# Patient Record
Sex: Female | Born: 1955
Health system: Southern US, Community
[De-identification: ages and names within clinical notes are randomized; demographics above are authoritative.]

## PROBLEM LIST (undated history)

## (undated) DIAGNOSIS — I10 Essential (primary) hypertension: Secondary | ICD-10-CM

## (undated) DIAGNOSIS — R600 Localized edema: Secondary | ICD-10-CM

## (undated) DIAGNOSIS — E785 Hyperlipidemia, unspecified: Secondary | ICD-10-CM

## (undated) DIAGNOSIS — L719 Rosacea, unspecified: Secondary | ICD-10-CM

## (undated) DIAGNOSIS — R609 Edema, unspecified: Secondary | ICD-10-CM

## (undated) HISTORY — DX: Localized edema: R60.0

## (undated) HISTORY — DX: Edema, unspecified: R60.9

## (undated) HISTORY — DX: Rosacea, unspecified: L71.9

## (undated) HISTORY — PX: CYSTECTOMY: SUR359

## (undated) HISTORY — DX: Essential (primary) hypertension: I10

## (undated) HISTORY — DX: Hyperlipidemia, unspecified: E78.5

---

## 1998-03-04 ENCOUNTER — Ambulatory Visit (HOSPITAL_COMMUNITY): Admission: RE | Admit: 1998-03-04 | Discharge: 1998-03-04 | Payer: Self-pay | Admitting: *Deleted

## 1998-03-18 ENCOUNTER — Encounter: Admission: RE | Admit: 1998-03-18 | Discharge: 1998-06-16 | Payer: Self-pay | Admitting: *Deleted

## 1998-04-29 ENCOUNTER — Inpatient Hospital Stay (HOSPITAL_COMMUNITY): Admission: AD | Admit: 1998-04-29 | Discharge: 1998-04-29 | Payer: Self-pay | Admitting: Obstetrics and Gynecology

## 1998-05-08 ENCOUNTER — Inpatient Hospital Stay (HOSPITAL_COMMUNITY): Admission: AD | Admit: 1998-05-08 | Discharge: 1998-05-11 | Payer: Self-pay | Admitting: *Deleted

## 1998-06-16 ENCOUNTER — Other Ambulatory Visit: Admission: RE | Admit: 1998-06-16 | Discharge: 1998-06-16 | Payer: Self-pay | Admitting: *Deleted

## 2000-08-24 DIAGNOSIS — I10 Essential (primary) hypertension: Secondary | ICD-10-CM

## 2000-08-24 DIAGNOSIS — I152 Hypertension secondary to endocrine disorders: Secondary | ICD-10-CM | POA: Insufficient documentation

## 2001-01-24 DIAGNOSIS — L719 Rosacea, unspecified: Secondary | ICD-10-CM | POA: Insufficient documentation

## 2001-03-20 ENCOUNTER — Other Ambulatory Visit: Admission: RE | Admit: 2001-03-20 | Discharge: 2001-03-20 | Payer: Self-pay | Admitting: Family Medicine

## 2001-04-06 ENCOUNTER — Encounter: Payer: Self-pay | Admitting: Family Medicine

## 2001-04-06 ENCOUNTER — Ambulatory Visit (HOSPITAL_COMMUNITY): Admission: RE | Admit: 2001-04-06 | Discharge: 2001-04-06 | Payer: Self-pay | Admitting: Family Medicine

## 2001-11-24 ENCOUNTER — Encounter: Payer: Self-pay | Admitting: Family Medicine

## 2001-11-24 DIAGNOSIS — E1159 Type 2 diabetes mellitus with other circulatory complications: Secondary | ICD-10-CM | POA: Insufficient documentation

## 2001-11-24 DIAGNOSIS — E11319 Type 2 diabetes mellitus with unspecified diabetic retinopathy without macular edema: Secondary | ICD-10-CM | POA: Insufficient documentation

## 2001-11-24 DIAGNOSIS — E119 Type 2 diabetes mellitus without complications: Secondary | ICD-10-CM

## 2002-02-24 ENCOUNTER — Encounter: Payer: Self-pay | Admitting: Family Medicine

## 2002-04-26 HISTORY — PX: CARDIAC CATHETERIZATION: SHX172

## 2002-05-27 ENCOUNTER — Encounter: Payer: Self-pay | Admitting: Family Medicine

## 2002-12-17 ENCOUNTER — Ambulatory Visit (HOSPITAL_COMMUNITY): Admission: RE | Admit: 2002-12-17 | Discharge: 2002-12-17 | Payer: Self-pay | Admitting: *Deleted

## 2002-12-17 ENCOUNTER — Encounter: Payer: Self-pay | Admitting: *Deleted

## 2003-06-25 ENCOUNTER — Encounter: Payer: Self-pay | Admitting: Family Medicine

## 2003-06-25 LAB — CONVERTED CEMR LAB
Hgb A1c MFr Bld: 6 %
Microalbumin U total vol: 22.1 mg/L

## 2003-12-26 ENCOUNTER — Encounter: Payer: Self-pay | Admitting: Family Medicine

## 2004-06-04 ENCOUNTER — Ambulatory Visit: Payer: Self-pay | Admitting: Family Medicine

## 2004-09-02 ENCOUNTER — Emergency Department (HOSPITAL_COMMUNITY): Admission: EM | Admit: 2004-09-02 | Discharge: 2004-09-02 | Payer: Self-pay | Admitting: Emergency Medicine

## 2004-12-07 ENCOUNTER — Ambulatory Visit: Payer: Self-pay | Admitting: Family Medicine

## 2005-11-26 ENCOUNTER — Ambulatory Visit: Payer: Self-pay | Admitting: Family Medicine

## 2005-12-03 ENCOUNTER — Ambulatory Visit: Payer: Self-pay | Admitting: Family Medicine

## 2005-12-25 ENCOUNTER — Encounter: Payer: Self-pay | Admitting: Family Medicine

## 2005-12-31 ENCOUNTER — Ambulatory Visit: Payer: Self-pay | Admitting: Internal Medicine

## 2006-01-20 ENCOUNTER — Ambulatory Visit: Payer: Self-pay | Admitting: Family Medicine

## 2006-01-25 ENCOUNTER — Ambulatory Visit: Payer: Self-pay | Admitting: Family Medicine

## 2006-02-01 ENCOUNTER — Ambulatory Visit: Payer: Self-pay | Admitting: Family Medicine

## 2006-03-26 ENCOUNTER — Encounter: Payer: Self-pay | Admitting: Family Medicine

## 2006-03-26 LAB — CONVERTED CEMR LAB
Hgb A1c MFr Bld: 6.2 %
Pap Smear: NORMAL

## 2006-04-04 ENCOUNTER — Encounter: Payer: Self-pay | Admitting: Family Medicine

## 2006-04-04 ENCOUNTER — Other Ambulatory Visit: Admission: RE | Admit: 2006-04-04 | Discharge: 2006-04-04 | Payer: Self-pay | Admitting: Family Medicine

## 2006-04-04 ENCOUNTER — Ambulatory Visit: Payer: Self-pay | Admitting: Family Medicine

## 2006-04-05 ENCOUNTER — Ambulatory Visit: Payer: Self-pay | Admitting: Cardiovascular Disease

## 2006-04-28 ENCOUNTER — Ambulatory Visit: Payer: Self-pay | Admitting: Family Medicine

## 2006-06-25 ENCOUNTER — Encounter: Payer: Self-pay | Admitting: Family Medicine

## 2006-07-04 ENCOUNTER — Ambulatory Visit: Payer: Self-pay | Admitting: Family Medicine

## 2006-07-04 LAB — CONVERTED CEMR LAB: Hgb A1c MFr Bld: 6.7 % — ABNORMAL HIGH (ref 4.6–6.1)

## 2006-07-13 ENCOUNTER — Ambulatory Visit: Payer: Self-pay | Admitting: Family Medicine

## 2006-07-13 LAB — CONVERTED CEMR LAB
BUN: 18 mg/dL (ref 6–23)
CO2: 24 meq/L (ref 19–32)
Calcium: 9.9 mg/dL (ref 8.4–10.5)
Chloride: 104 meq/L (ref 96–112)
Creatinine, Ser: 0.94 mg/dL (ref 0.40–1.20)
Glucose, Bld: 93 mg/dL (ref 70–99)
Potassium: 3.6 meq/L (ref 3.5–5.3)
Sodium: 140 meq/L (ref 135–145)

## 2006-07-15 ENCOUNTER — Encounter: Payer: Self-pay | Admitting: Family Medicine

## 2006-07-18 DIAGNOSIS — R609 Edema, unspecified: Secondary | ICD-10-CM | POA: Insufficient documentation

## 2006-11-21 ENCOUNTER — Encounter (INDEPENDENT_AMBULATORY_CARE_PROVIDER_SITE_OTHER): Payer: Self-pay | Admitting: *Deleted

## 2006-12-07 ENCOUNTER — Encounter (INDEPENDENT_AMBULATORY_CARE_PROVIDER_SITE_OTHER): Payer: Self-pay | Admitting: *Deleted

## 2007-01-23 ENCOUNTER — Telehealth: Payer: Self-pay | Admitting: Family Medicine

## 2007-05-08 ENCOUNTER — Ambulatory Visit: Payer: Self-pay | Admitting: Family Medicine

## 2007-05-08 DIAGNOSIS — R0789 Other chest pain: Secondary | ICD-10-CM | POA: Insufficient documentation

## 2007-05-11 ENCOUNTER — Ambulatory Visit: Payer: Self-pay | Admitting: Family Medicine

## 2007-05-12 ENCOUNTER — Encounter: Payer: Self-pay | Admitting: Family Medicine

## 2007-05-12 ENCOUNTER — Ambulatory Visit: Payer: Self-pay

## 2007-05-15 ENCOUNTER — Ambulatory Visit: Payer: Self-pay | Admitting: Family Medicine

## 2007-05-15 DIAGNOSIS — E1169 Type 2 diabetes mellitus with other specified complication: Secondary | ICD-10-CM | POA: Insufficient documentation

## 2007-05-15 DIAGNOSIS — E785 Hyperlipidemia, unspecified: Secondary | ICD-10-CM

## 2007-05-17 ENCOUNTER — Telehealth (INDEPENDENT_AMBULATORY_CARE_PROVIDER_SITE_OTHER): Payer: Self-pay | Admitting: *Deleted

## 2007-05-30 ENCOUNTER — Telehealth: Payer: Self-pay | Admitting: Family Medicine

## 2007-07-07 ENCOUNTER — Telehealth: Payer: Self-pay | Admitting: Family Medicine

## 2007-07-17 ENCOUNTER — Emergency Department (HOSPITAL_COMMUNITY): Admission: EM | Admit: 2007-07-17 | Discharge: 2007-07-17 | Payer: Self-pay | Admitting: Emergency Medicine

## 2007-07-17 ENCOUNTER — Telehealth: Payer: Self-pay | Admitting: Family Medicine

## 2007-08-14 ENCOUNTER — Ambulatory Visit: Payer: Self-pay | Admitting: Family Medicine

## 2007-08-17 LAB — CONVERTED CEMR LAB
Cholesterol: 169 mg/dL (ref 0–200)
HDL: 29.9 mg/dL — ABNORMAL LOW (ref >39.0)
LDL Cholesterol: 118 mg/dL — ABNORMAL HIGH (ref 0–99)
Triglycerides: 105 mg/dL (ref 0–149)
VLDL: 21 mg/dL (ref 0–40)

## 2007-09-06 ENCOUNTER — Ambulatory Visit: Payer: Self-pay | Admitting: Family Medicine

## 2007-12-06 ENCOUNTER — Encounter (INDEPENDENT_AMBULATORY_CARE_PROVIDER_SITE_OTHER): Payer: Self-pay | Admitting: *Deleted

## 2007-12-11 ENCOUNTER — Encounter (INDEPENDENT_AMBULATORY_CARE_PROVIDER_SITE_OTHER): Payer: Self-pay | Admitting: *Deleted

## 2007-12-19 ENCOUNTER — Telehealth: Payer: Self-pay | Admitting: Family Medicine

## 2007-12-19 ENCOUNTER — Encounter: Payer: Self-pay | Admitting: Family Medicine

## 2008-03-25 ENCOUNTER — Ambulatory Visit: Payer: Self-pay | Admitting: Family Medicine

## 2008-03-29 ENCOUNTER — Ambulatory Visit: Payer: Self-pay | Admitting: Family Medicine

## 2008-03-29 LAB — CONVERTED CEMR LAB
Bilirubin, Direct: 0.2 mg/dL (ref 0.0–0.3)
Indirect Bilirubin: 0.8 mg/dL (ref 0.0–0.9)
LDL Cholesterol: 113 mg/dL — ABNORMAL HIGH (ref 0–99)
Total Protein: 7.7 g/dL (ref 6.0–8.3)
Triglycerides: 80 mg/dL (ref <150)
VLDL: 16 mg/dL (ref 0–40)

## 2008-04-09 ENCOUNTER — Ambulatory Visit: Payer: Self-pay | Admitting: Family Medicine

## 2008-04-09 DIAGNOSIS — R109 Unspecified abdominal pain: Secondary | ICD-10-CM | POA: Insufficient documentation

## 2008-04-09 DIAGNOSIS — S335XXA Sprain of ligaments of lumbar spine, initial encounter: Secondary | ICD-10-CM | POA: Insufficient documentation

## 2008-04-09 LAB — CONVERTED CEMR LAB
Bilirubin Urine: NEGATIVE
Glucose, Urine, Semiquant: NEGATIVE
pH: 5

## 2008-05-14 ENCOUNTER — Ambulatory Visit (HOSPITAL_COMMUNITY): Admission: RE | Admit: 2008-05-14 | Discharge: 2008-05-14 | Payer: Self-pay | Admitting: Family Medicine

## 2008-05-14 LAB — HM MAMMOGRAPHY

## 2008-05-16 ENCOUNTER — Encounter (INDEPENDENT_AMBULATORY_CARE_PROVIDER_SITE_OTHER): Payer: Self-pay | Admitting: *Deleted

## 2008-06-04 ENCOUNTER — Ambulatory Visit: Payer: Self-pay | Admitting: Family Medicine

## 2008-06-04 ENCOUNTER — Telehealth: Payer: Self-pay | Admitting: Family Medicine

## 2008-06-05 LAB — CONVERTED CEMR LAB
ALT: 18 units/L (ref 0–35)
AST: 18 units/L (ref 0–37)
Albumin: 4.5 g/dL (ref 3.5–5.2)
Basophils Absolute: 0 10*3/uL (ref 0.0–0.1)
Basophils Relative: 0 % (ref 0–1)
Calcium: 10.1 mg/dL (ref 8.4–10.5)
Chloride: 100 meq/L (ref 96–112)
MCHC: 33.9 g/dL (ref 30.0–36.0)
Monocytes Relative: 9 % (ref 3–12)
Neutro Abs: 2.5 10*3/uL (ref 1.7–7.7)
Neutrophils Relative %: 62 % (ref 43–77)
Platelets: 188 10*3/uL (ref 150–400)
Potassium: 3.6 meq/L (ref 3.5–5.3)
RBC: 4.49 M/uL (ref 3.87–5.11)
RDW: 12.7 % (ref 11.5–15.5)
Sodium: 137 meq/L (ref 135–145)

## 2008-06-07 ENCOUNTER — Ambulatory Visit: Payer: Self-pay

## 2008-06-17 ENCOUNTER — Ambulatory Visit: Payer: Self-pay | Admitting: Family Medicine

## 2008-06-20 ENCOUNTER — Encounter: Payer: Self-pay | Admitting: Family Medicine

## 2008-06-20 LAB — CONVERTED CEMR LAB
AST: 16 units/L (ref 0–37)
Alkaline Phosphatase: 53 units/L (ref 39–117)
BUN: 17 mg/dL (ref 6–23)
Calcium: 9.6 mg/dL (ref 8.4–10.5)
Creatinine, Ser: 0.94 mg/dL (ref 0.40–1.20)
Glucose, Bld: 180 mg/dL — ABNORMAL HIGH (ref 70–99)
HDL: 33 mg/dL — ABNORMAL LOW (ref >39)
Hgb A1c MFr Bld: 7.8 % — ABNORMAL HIGH (ref 4.6–6.1)
LDL Cholesterol: 72 mg/dL (ref 0–99)
Total CHOL/HDL Ratio: 3.8
Triglycerides: 94 mg/dL (ref <150)

## 2008-06-24 ENCOUNTER — Ambulatory Visit: Payer: Self-pay | Admitting: Family Medicine

## 2008-06-24 ENCOUNTER — Other Ambulatory Visit: Admission: RE | Admit: 2008-06-24 | Discharge: 2008-06-24 | Payer: Self-pay | Admitting: Family Medicine

## 2008-06-24 ENCOUNTER — Encounter: Payer: Self-pay | Admitting: Family Medicine

## 2008-06-27 ENCOUNTER — Encounter (INDEPENDENT_AMBULATORY_CARE_PROVIDER_SITE_OTHER): Payer: Self-pay | Admitting: *Deleted

## 2008-11-13 ENCOUNTER — Ambulatory Visit: Payer: Self-pay | Admitting: Family Medicine

## 2008-11-14 ENCOUNTER — Encounter: Payer: Self-pay | Admitting: Family Medicine

## 2008-11-18 LAB — CONVERTED CEMR LAB
BUN: 17 mg/dL (ref 6–23)
Chloride: 103 meq/L (ref 96–112)
Creatinine, Ser: 1.09 mg/dL (ref 0.40–1.20)
Glucose, Bld: 130 mg/dL — ABNORMAL HIGH (ref 70–99)

## 2009-01-02 ENCOUNTER — Ambulatory Visit: Payer: Self-pay | Admitting: Family Medicine

## 2009-01-02 DIAGNOSIS — R21 Rash and other nonspecific skin eruption: Secondary | ICD-10-CM | POA: Insufficient documentation

## 2009-02-07 ENCOUNTER — Ambulatory Visit: Payer: Self-pay | Admitting: Family Medicine

## 2009-02-07 DIAGNOSIS — E109 Type 1 diabetes mellitus without complications: Secondary | ICD-10-CM | POA: Insufficient documentation

## 2009-02-07 DIAGNOSIS — E119 Type 2 diabetes mellitus without complications: Secondary | ICD-10-CM | POA: Insufficient documentation

## 2009-02-10 LAB — CONVERTED CEMR LAB
Alkaline Phosphatase: 49 units/L (ref 39–117)
CO2: 25 meq/L (ref 19–32)
Cholesterol: 158 mg/dL (ref 0–200)
Creatinine, Ser: 0.97 mg/dL (ref 0.40–1.20)
Glucose, Bld: 112 mg/dL — ABNORMAL HIGH (ref 70–99)
HDL: 35 mg/dL — ABNORMAL LOW (ref >39)
LDL Cholesterol: 102 mg/dL — ABNORMAL HIGH (ref 0–99)
Sodium: 142 meq/L (ref 135–145)
Total Bilirubin: 1 mg/dL (ref 0.3–1.2)
Total CHOL/HDL Ratio: 4.5
Total Protein: 7.3 g/dL (ref 6.0–8.3)
Triglycerides: 107 mg/dL (ref <150)
VLDL: 21 mg/dL (ref 0–40)

## 2009-02-20 ENCOUNTER — Ambulatory Visit: Payer: Self-pay | Admitting: Family Medicine

## 2009-02-20 DIAGNOSIS — J309 Allergic rhinitis, unspecified: Secondary | ICD-10-CM | POA: Insufficient documentation

## 2009-02-20 DIAGNOSIS — J069 Acute upper respiratory infection, unspecified: Secondary | ICD-10-CM | POA: Insufficient documentation

## 2009-04-17 ENCOUNTER — Ambulatory Visit: Payer: Self-pay | Admitting: Family Medicine

## 2009-04-17 DIAGNOSIS — M549 Dorsalgia, unspecified: Secondary | ICD-10-CM | POA: Insufficient documentation

## 2009-04-17 DIAGNOSIS — M5126 Other intervertebral disc displacement, lumbar region: Secondary | ICD-10-CM | POA: Insufficient documentation

## 2009-06-16 ENCOUNTER — Telehealth: Payer: Self-pay | Admitting: Family Medicine

## 2009-09-03 ENCOUNTER — Ambulatory Visit: Payer: Self-pay | Admitting: Family Medicine

## 2009-09-05 ENCOUNTER — Ambulatory Visit: Payer: Self-pay | Admitting: Cardiovascular Disease

## 2009-09-11 ENCOUNTER — Ambulatory Visit: Payer: Self-pay | Admitting: Family Medicine

## 2009-09-17 LAB — CONVERTED CEMR LAB
ALT: 26 units/L (ref 0–35)
AST: 20 units/L (ref 0–37)
Alkaline Phosphatase: 66 units/L (ref 39–117)
Basophils Relative: 0.6 % (ref 0.0–3.0)
Bilirubin, Direct: 0.1 mg/dL (ref 0.0–0.3)
CO2: 31 meq/L (ref 19–32)
Chloride: 98 meq/L (ref 96–112)
Creatinine, Ser: 1 mg/dL (ref 0.4–1.2)
Eosinophils Absolute: 0.2 10*3/uL (ref 0.0–0.7)
Eosinophils Relative: 4.3 % (ref 0.0–5.0)
HCT: 38.3 % (ref 36.0–46.0)
LDL Cholesterol: 72 mg/dL (ref 0–99)
Lymphs Abs: 1.1 10*3/uL (ref 0.7–4.0)
MCHC: 34.8 g/dL (ref 30.0–36.0)
MCV: 87.7 fL (ref 78.0–100.0)
Monocytes Absolute: 0.4 10*3/uL (ref 0.1–1.0)
Neutrophils Relative %: 62.5 % (ref 43.0–77.0)
Platelets: 197 10*3/uL (ref 150.0–400.0)
Potassium: 3.7 meq/L (ref 3.5–5.1)
Sodium: 138 meq/L (ref 135–145)
Total Bilirubin: 0.8 mg/dL (ref 0.3–1.2)
Total CHOL/HDL Ratio: 4
Total Protein: 7.7 g/dL (ref 6.0–8.3)
Triglycerides: 132 mg/dL (ref 0.0–149.0)
WBC: 4.9 10*3/uL (ref 4.5–10.5)

## 2009-09-19 ENCOUNTER — Ambulatory Visit: Payer: Self-pay | Admitting: Family Medicine

## 2009-09-19 LAB — CONVERTED CEMR LAB: Hgb A1c MFr Bld: 8.8 % — ABNORMAL HIGH (ref 4.6–6.5)

## 2010-05-17 ENCOUNTER — Encounter: Payer: Self-pay | Admitting: Family Medicine

## 2010-05-24 LAB — CONVERTED CEMR LAB
ALT: 14 units/L (ref 0–35)
AST: 14 units/L (ref 0–37)
Alkaline Phosphatase: 76 units/L (ref 39–117)
Cholesterol: 178 mg/dL (ref 0–200)
Creatinine, Urine: 223.6 mg/dL
Indirect Bilirubin: 0.8 mg/dL (ref 0.0–0.9)
Microalb, Ur: 7.07 mg/dL — ABNORMAL HIGH (ref 0.00–1.89)
Total Protein: 8.1 g/dL (ref 6.0–8.3)
Triglycerides: 77 mg/dL (ref <150)

## 2010-05-28 NOTE — Letter (Signed)
Summary: PHI  PHI   Imported By: Harlon Flor 09/08/2009 16:49:00  _____________________________________________________________________  External Attachment:    Type:   Image     Comment:   External Document

## 2010-05-28 NOTE — Assessment & Plan Note (Signed)
Summary: RIGHT SIDE CHEST PAIN X SEVERAL DAYS   Vital Signs:  Patient profile:   55 year old female Height:      67 inches Weight:      181.25 pounds BMI:     28.49 Temp:     98.3 degrees F oral Pulse rate:   60 / minute Pulse rhythm:   regular BP sitting:   142 / 80  (left arm) Cuff size:   regular  Vitals Entered By: Linde Gillis CMA Duncan Dull) (Sep 03, 2009 2:02 PM) CC: right sided chest pain times one week   History of Present Illness: High risk patient with DM, HTn and high cholesterol with chespt pain x 1 week. Describes as intermittant chest pain.Marland Kitchenon left and right..has current pain.  Initial worse for 3-4 min  then improves some but nagging pan for 1 hour. Occurs at rest and sometimes with exertion. no relationship to eating.  Occ feels pullling in rightchest when turing head to left. Fatgue, short of breath with walking when having chest pain. Feels like pressure. No peripheral swelling.  No pain radiating down left arm or up neck. No sweating. Occ sour taste in mouth, no relux, no burping, o abdominal pain.   4/10 on pain scale.  Not taking any medicaiotn.   Last stress test neg several years ago. Neg cath in 2004.  Problems Prior to Update: 1)  Chest Pain, Atypical  (ICD-786.59) 2)  Back Pain  (ICD-724.5) 3)  Herniated Lumbar Disc  (ICD-722.10) 4)  Uri  (ICD-465.9) 5)  Allergic Rhinitis  (ICD-477.9) 6)  Diab w/o Comp Type I [juv] Not Stated Uncntrl  (ICD-250.01) 7)  Skin Rash  (ICD-782.1) 8)  Routine Gynecological Examination  (ICD-V72.31) 9)  Well Adult Exam  (ICD-V70.0) 10)  Abdominal Pain, Lower  (ICD-789.09) 11)  Lumbar Strain, Acute  (ICD-847.2) 12)  Other Screening Mammogram  (ICD-V76.12) 13)  Hyperlipidemia  (ICD-272.4) 14)  Chest Pain, Atypical  (ICD-786.59) 15)  Family History of Cad Female 1st Degree Relative <50  (ICD-V17.3) 16)  Family History of Cad Female 1st Degree Relative <60  (ICD-V16.49) 17)  Family History Diabetes 1st Degree Relative   (ICD-V18.0) 18)  Hx of Peripheral Edema  (ICD-782.3) 19)  Aodm  (ICD-250.00) 20)  Hypertension, Malignant  (ICD-401.0) 21)  Rosacea  (ICD-695.3)  Current Medications (verified): 1)  Benicar Hct 40-25 Mg Tabs (Olmesartan Medoxomil-Hctz) .... Take 1 Tablet By Mouth Once A Day 2)  Ramipril 10 Mg Caps (Ramipril) .Marland Kitchen.. 1 Tab By Mouth Two Times A Day 3)  Metoprolol Succinate 50 Mg Xr24h-Tab (Metoprolol Succinate) .... Take 1 Tablet By Mouth Once A Day 4)  Simvastatin 40 Mg  Tabs (Simvastatin) .... Take 1 Tablet By Mouth Once A Day 5)  Amlodipine Besylate 5 Mg Tabs (Amlodipine Besylate) .Marland Kitchen.. 1 Tab By Mouth Daily 6)  Metformin Hcl 500 Mg Xr24h-Tab (Metformin Hcl) .... 2 Tab By Mouth Daily 7)  Glucometer of Choice .... Check Blood Sugar Fasting and After One Meal of The Day Dx 250.00 8)  Test Strips of Choice .... Check Blood Sugar Fasting and After One Meal of The Day Dx 250.00 9)  Lancets  Misc (Lancets) .... Check Blood Sugar Twice Daily Dx 250.00 10)  Cyclobenzaprine Hcl 10 Mg  Tabs (Cyclobenzaprine Hcl) .Marland Kitchen.. 1 By Mouth 3 Times Daily As Needed For Back Pain 11)  Hydrocodone-Acetaminophen 5-325 Mg Tabs (Hydrocodone-Acetaminophen) .Marland Kitchen.. 1 By Mouth Up To 4 Times Per Day As Needed For Pain  Allergies (verified): No Known  Drug Allergies  Past History:  Past medical, surgical, family and social histories (including risk factors) reviewed, and no changes noted (except as noted below).  Past Medical History: Reviewed history from 04/17/2009 and no changes required. HYPERLIPIDEMIA (ICD-272.4) Hx of PERIPHERAL EDEMA (ICD-782.3) AODM (ICD-250.00) HYPERTENSION, MALIGNANT (ICD-401.0) ROSACEA (ICD-695.3)    Past Surgical History: Reviewed history from 07/15/2006 and no changes required. Cardiac cath - normal 8/04 venous doppler - negative 12/07 Foot exam :  12/2005  Family History: Reviewed history from 07/15/2006 and no changes required. father: CVA, CAD mother: pacemaker, sleep apnea, heart  problems 4 brothers: DM, liver cirrhosis, ETOH abuse asthma, HTN 5 sisters: HTN, DM Family History Diabetes 1st degree relative Family History of CAD Female 1st degree relative <60 Family History of CAD Female 1st degree relative <50  Social History: Reviewed history from 07/15/2006 and no changes required. 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 CV:  Complains of chest pain or discomfort; denies fainting, leg cramps with exertion, swelling of feet, and swelling of hands. Resp:  Complains of shortness of breath; denies cough. GI:  Denies abdominal pain. GU:  Denies dysuria.  Physical Exam  General:  overweight female in NAD Mouth:  MMM Neck:  no carotid bruit or thyromegaly no cervical or supraclavicular lymphadenopathy  Lungs:  Normal respiratory effort, chest expands symmetrically. Lungs are clear to auscultation, no crackles or wheezes. Heart:  Normal rate and regular rhythm. S1 and S2 normal without gallop, murmur, click, rub or other extra sounds. Abdomen:  Bowel sounds positive,abdomen soft and non-tender without masses, organomegaly or hernias noted. Pulses:  R and L posterior tibial pulses are full and equal bilaterally  Extremities:  no edmea    Impression & Recommendations:  Problem # 1:  CHEST PAIN, ATYPICAL (ICD-786.59) Several years ago neg cardioyte. EKG today..no clear new symtpoms. Given pt high risk and with concerning possibly cardiac symptoms..will refer to Cardiology for eval and possible Cardolyte in next few days.  Given possible.Marland KitchenGERD symptoms will add PPI. No clear infectious symptoms, MSK pain or anxiety.  Go to ER if chest pain increasing.  Orders: EKG w/ Interpretation (93000) Cardiology Referral (Cardiology)  Problem # 2:  AODM (ICD-250.00)  Inadequate control. Overdue for eval.  Her updated medication list for  this problem includes:    Benicar Hct 40-25 Mg Tabs (Olmesartan medoxomil-hctz) .Marland Kitchen... Take 1 tablet by mouth once a day    Ramipril 10 Mg Caps (Ramipril) .Marland Kitchen... 1 tab by mouth two times a day    Metformin Hcl 500 Mg Xr24h-tab (Metformin hcl) .Marland Kitchen... 2 tab by mouth daily  Labs Reviewed: Creat: 0.97 (02/07/2009)   Microalbumin: 22.1 (06/25/2003) Reviewed HgBA1c results: 7.6 (02/07/2009)  7.6 (11/14/2008)  Orders: Cardiology Referral (Cardiology)  Problem # 3:  HYPERLIPIDEMIA (ICD-272.4)  INadequate control.Marland Kitchenoverdue for eval.  Her updated medication list for this problem includes:    Simvastatin 40 Mg Tabs (Simvastatin) .Marland Kitchen... Take 1 tablet by mouth once a day  Labs Reviewed: SGOT: 18 (02/07/2009)   SGPT: 18 (02/07/2009)  Lipid Goals: Chol Goal: 200 (05/15/2007)   HDL Goal: 40 (05/15/2007)   LDL Goal: 100 (05/15/2007)   TG Goal: 150 (05/15/2007)  Prior 10 Yr Risk Heart Disease: 24 % (02/20/2009)   HDL:35 (02/07/2009), 33 (06/17/2008)  LDL:102 (02/07/2009), 72 (19/14/7829)  Chol:158 (02/07/2009), 124 (06/17/2008)  Trig:107 (02/07/2009), 94 (06/17/2008)  Orders: Cardiology Referral (Cardiology)  Problem # 4:  HYPERTENSION, MALIGNANT (ICD-401.0)  Fairly well controlled for her at this time.  Her updated medication list for this problem includes:    Benicar Hct 40-25 Mg Tabs (Olmesartan medoxomil-hctz) .Marland Kitchen... Take 1 tablet by mouth once a day    Ramipril 10 Mg Caps (Ramipril) .Marland Kitchen... 1 tab by mouth two times a day    Metoprolol Succinate 50 Mg Xr24h-tab (Metoprolol succinate) .Marland Kitchen... Take 1 tablet by mouth once a day    Amlodipine Besylate 5 Mg Tabs (Amlodipine besylate) .Marland Kitchen... 1 tab by mouth daily  BP today: 142/80 Prior BP: 150/100 (04/17/2009)  Prior 10 Yr Risk Heart Disease: 24 % (02/20/2009)  Labs Reviewed: K+: 3.6 (02/07/2009) Creat: : 0.97 (02/07/2009)   Chol: 158 (02/07/2009)   HDL: 35 (02/07/2009)   LDL: 102 (02/07/2009)   TG: 107 (02/07/2009)  Orders: Cardiology Referral  (Cardiology)  Complete Medication List: 1)  Benicar Hct 40-25 Mg Tabs (Olmesartan medoxomil-hctz) .... Take 1 tablet by mouth once a day 2)  Ramipril 10 Mg Caps (Ramipril) .Marland Kitchen.. 1 tab by mouth two times a day 3)  Metoprolol Succinate 50 Mg Xr24h-tab (Metoprolol succinate) .... Take 1 tablet by mouth once a day 4)  Simvastatin 40 Mg Tabs (Simvastatin) .... Take 1 tablet by mouth once a day 5)  Amlodipine Besylate 5 Mg Tabs (Amlodipine besylate) .Marland Kitchen.. 1 tab by mouth daily 6)  Metformin Hcl 500 Mg Xr24h-tab (Metformin hcl) .... 2 tab by mouth daily 7)  Glucometer of Choice  .... Check blood sugar fasting and after one meal of the day dx 250.00 8)  Test Strips of Choice  .... Check blood sugar fasting and after one meal of the day dx 250.00 9)  Lancets Misc (Lancets) .... Check blood sugar twice daily dx 250.00 10)  Cyclobenzaprine Hcl 10 Mg Tabs (Cyclobenzaprine hcl) .Marland Kitchen.. 1 by mouth 3 times daily as needed for back pain 11)  Hydrocodone-acetaminophen 5-325 Mg Tabs (Hydrocodone-acetaminophen) .Marland Kitchen.. 1 by mouth up to 4 times per day as needed for pain 12)  Omeprazole 40 Mg Cpdr (Omeprazole) .Marland Kitchen.. 1 tab by mouth daily  Patient Instructions: 1)  Fasting lipids, CMET, TSH, cbc Dx 401.1 2)  Go to ER if severe or worsening  chest pain and shortness of breath.  3)  Start omeprsazole 40 mg daily. 4)  Referral Appointment Information 5)  Day/Date: 6)  Time: 7)  Place/MD: 8)  Address: 9)  Phone/Fax: 10)  Patient given appointment information. Information/Orders faxed/mailed.  Prescriptions: OMEPRAZOLE 40 MG CPDR (OMEPRAZOLE) 1 tab by mouth daily  #30 x 11   Entered and Authorized by:   Kerby Nora MD   Signed by:   Kerby Nora MD on 09/03/2009   Method used:   [facility] to        [facility] [phone]* (retail)       [address]       [place], [place]  [address]       Ph: [phone]       Fax: [phone]   RxID:   [id number]   Current Allergies (reviewed today): No  known allergies

## 2010-05-28 NOTE — Assessment & Plan Note (Signed)
Summary: CHEST PAIN/AMD   Primary Provider:  Kerby Nora MD  CC:  Consult; Chest pain; SOB.  History of Present Illness: 55 year old woman with a history of diabetes, hyperlipidemia, hypertension, family  history of coronary artery disease, presenting with chest discomfort radiating from the right side to the left side that lasted all week long last week it seemed to wax and wane.  She described her pain is predominantly on the right side, hurting more when she turned her head to the right with a clear positional nature to her chest pain, sometimes radiating a little bit to the left. It was constant, seemed to come on at rest and sometimes with exertion. It seemed to improve with proton pump inhibitor medication. She has not tried nonsteroidal anti-inflammatories.  cardiac catheter in 2004 showing minimal disease. Chest pain at that time was thought secondary to noncardiac etiology.  Stress test in 2000 Showed ejection fraction 59%, no significant EKG changes, no signs of ischemia.  Blood work from October 2010 shows total cholesterol 158, LDL 102, HDL 35, normal LFTs. Hemoglobin A1c 7.6.  Problems Prior to Update: 1)  Chest Pain, Atypical  (ICD-786.59) 2)  Back Pain  (ICD-724.5) 3)  Herniated Lumbar Disc  (ICD-722.10) 4)  Uri  (ICD-465.9) 5)  Allergic Rhinitis  (ICD-477.9) 6)  Diab w/o Comp Type I [juv] Not Stated Uncntrl  (ICD-250.01) 7)  Skin Rash  (ICD-782.1) 8)  Routine Gynecological Examination  (ICD-V72.31) 9)  Well Adult Exam  (ICD-V70.0) 10)  Abdominal Pain, Lower  (ICD-789.09) 11)  Lumbar Strain, Acute  (ICD-847.2) 12)  Other Screening Mammogram  (ICD-V76.12) 13)  Hyperlipidemia  (ICD-272.4) 14)  Chest Pain, Atypical  (ICD-786.59) 15)  Family History of Cad Female 1st Degree Relative <50  (ICD-V17.3) 16)  Family History of Cad Female 1st Degree Relative <60  (ICD-V16.49) 17)  Family History Diabetes 1st Degree Relative  (ICD-V18.0) 18)  Hx of Peripheral Edema   (ICD-782.3) 19)  Aodm  (ICD-250.00) 20)  Hypertension, Malignant  (ICD-401.0) 21)  Rosacea  (ICD-695.3)  Medications Prior to Update: 1)  Benicar Hct 40-25 Mg Tabs (Olmesartan Medoxomil-Hctz) .... Take 1 Tablet By Mouth Once A Day 2)  Ramipril 10 Mg Caps (Ramipril) .Marland Kitchen.. 1 Tab By Mouth Two Times A Day 3)  Metoprolol Succinate 50 Mg Xr24h-Tab (Metoprolol Succinate) .... Take 1 Tablet By Mouth Once A Day 4)  Simvastatin 40 Mg  Tabs (Simvastatin) .... Take 1 Tablet By Mouth Once A Day 5)  Amlodipine Besylate 5 Mg Tabs (Amlodipine Besylate) .Marland Kitchen.. 1 Tab By Mouth Daily 6)  Metformin Hcl 500 Mg Xr24h-Tab (Metformin Hcl) .... 2 Tab By Mouth Daily 7)  Glucometer of Choice .... Check Blood Sugar Fasting and After One Meal of The Day Dx 250.00 8)  Test Strips of Choice .... Check Blood Sugar Fasting and After One Meal of The Day Dx 250.00 9)  Lancets  Misc (Lancets) .... Check Blood Sugar Twice Daily Dx 250.00 10)  Cyclobenzaprine Hcl 10 Mg  Tabs (Cyclobenzaprine Hcl) .Marland Kitchen.. 1 By Mouth 3 Times Daily As Needed For Back Pain 11)  Hydrocodone-Acetaminophen 5-325 Mg Tabs (Hydrocodone-Acetaminophen) .Marland Kitchen.. 1 By Mouth Up To 4 Times Per Day As Needed For Pain 12)  Omeprazole 40 Mg Cpdr (Omeprazole) .Marland Kitchen.. 1 Tab By Mouth Daily  Current Medications (verified): 1)  Benicar Hct 40-25 Mg Tabs (Olmesartan Medoxomil-Hctz) .... Take 1 Tablet By Mouth Once A Day 2)  Ramipril 10 Mg Caps (Ramipril) .Marland Kitchen.. 1 Tab By Mouth Two Times A Day  3)  Metoprolol Succinate 50 Mg Xr24h-Tab (Metoprolol Succinate) .... Take 1 Tablet By Mouth Once A Day 4)  Simvastatin 40 Mg  Tabs (Simvastatin) .... Take 1 Tablet By Mouth Once A Day 5)  Amlodipine Besylate 5 Mg Tabs (Amlodipine Besylate) .Marland Kitchen.. 1 Tab By Mouth Daily 6)  Metformin Hcl 500 Mg Xr24h-Tab (Metformin Hcl) .... 2 Tab By Mouth Daily 7)  Glucometer of Choice .... Check Blood Sugar Fasting and After One Meal of The Day Dx 250.00 8)  Test Strips of Choice .... Check Blood Sugar Fasting and  After One Meal of The Day Dx 250.00 9)  Lancets  Misc (Lancets) .... Check Blood Sugar Twice Daily Dx 250.00 10)  Cyclobenzaprine Hcl 10 Mg  Tabs (Cyclobenzaprine Hcl) .Marland Kitchen.. 1 By Mouth 3 Times Daily As Needed For Back Pain 11)  Hydrocodone-Acetaminophen 5-325 Mg Tabs (Hydrocodone-Acetaminophen) .Marland Kitchen.. 1 By Mouth Up To 4 Times Per Day As Needed For Pain 12)  Omeprazole 40 Mg Cpdr (Omeprazole) .Marland Kitchen.. 1 Tab By Mouth Daily  Allergies (verified): No Known Drug Allergies  Past History:  Past Medical History: Last updated: 04/17/2009 HYPERLIPIDEMIA (ICD-272.4) Hx of PERIPHERAL EDEMA (ICD-782.3) AODM (ICD-250.00) HYPERTENSION, MALIGNANT (ICD-401.0) ROSACEA (ICD-695.3)    Past Surgical History: Last updated: 07/15/2006 Cardiac cath - normal 8/04 venous doppler - negative 12/07 Foot exam :  12/2005  Family History: Last updated: 07/15/2006 father: CVA, CAD mother: pacemaker, sleep apnea, heart problems 4 brothers: DM, liver cirrhosis, ETOH abuse asthma, HTN 5 sisters: HTN, DM Family History Diabetes 1st degree relative Family History of CAD Female 1st degree relative <60 Family History of CAD Female 1st degree relative <50  Social History: Last updated: 07/15/2006 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  The patient denies fever, weight loss, weight gain, vision loss, decreased hearing, hoarseness, syncope, dyspnea on exertion, peripheral edema, prolonged cough, abdominal pain, incontinence, muscle weakness, depression, and enlarged lymph nodes.    Vital Signs:  Patient profile:   55 year old female Height:      67 inches Weight:      183 pounds BMI:     28.77 Pulse rate:   58 / minute BP sitting:   140 / 86  (left arm) Cuff size:   regular  Vitals  Entered By: Stanton Kidney, EMT-P (Sep 05, 2009 2:08 PM)  Physical Exam  General:  middle aged woman in no apparent distress, HENT exam is benign, or branch is clear, neck is supple with no JVP or carotid bruits, heart sounds are regular with S1-S2 and no murmurs appreciated, lungs are clear to auscultation with no wheezes or rales, abdominal exam is benign, no significant lower extremity edema, neurologic exam is grossly nonfocal, skin is warm and dry. Pulses are equal and symmetrical in her upper and lower extremities   EKG  Procedure date:  09/05/2009  Findings:      normal sinus rhythm with rate of 50 beats per minute, T wave inversions noted in leads 2, 3, aVF.  Impression & Recommendations:  Problem # 1:  CHEST PAIN, ATYPICAL (ICD-786.59) etiology of her chest pain is uncertain though clearly has some atypical components to it. It hurts more when she turns her head to the right. It has been getting better, seems to come on at rest as well  sometimes with exertion.  She has had a negative catheterization in the past, negative stress test in 2009. Given that her symptoms are somewhat atypical and resolving, I suggest that we watch her for now. I've urged her to resume her walking/exercise. If she develops symptoms that seem to be getting worse, and asked her to contact me. We will perform a repeat stress test for her.  Her blood pressure is borderline high though she states is well controlled at home.  We will call her in several days time to make sure that her chest pain is in fact resolving. She could try nonsteroidal anti-inflammatory medications if there is a musculoskeletal component. I cannot absolutely exclude pericarditis as she has had some EKG changes. Treatment for this would be ibuprofen and high doses. Her pattern does not typically fit pericarditis as it isbrought on with head turning and not positional changes.  Her updated medication list for this problem includes:     Ramipril 10 Mg Caps (Ramipril) .Marland Kitchen... 1 tab by mouth once daily    Metoprolol Succinate 50 Mg Xr24h-tab (Metoprolol succinate) .Marland Kitchen... Take 1 tablet by mouth once a day    Amlodipine Besylate 5 Mg Tabs (Amlodipine besylate) .Marland Kitchen... 1 tab by mouth daily  Problem # 2:  DIAB W/O COMP TYPE I [JUV] NOT STATED UNCNTRL (ICD-250.01) Hemoglobin A1c is 7.6 in October of last year. I've asked her to watch her diet to try to lose several pounds in an effort to get her hemoglobin A1c less than 7.  Ideally her LDL should be less than 100 and it is very close to that in October.  Her updated medication list for this problem includes:    Benicar Hct 40-25 Mg Tabs (Olmesartan medoxomil-hctz) .Marland Kitchen... Take 1 tablet by mouth once a day    Ramipril 10 Mg Caps (Ramipril) .Marland Kitchen... 1 tab by mouth once daily    Metformin Hcl 500 Mg Xr24h-tab (Metformin hcl) .Marland Kitchen... Take 1 by mouth once daily  Patient Instructions: 1)  Your physician recommends that you schedule a follow-up appointment in: 3 months

## 2010-05-28 NOTE — Progress Notes (Signed)
Summary: pharmacy wants to change benicar  Phone Note From Pharmacy   [facility]  [address] [phone] Summary of Call: Pharmacy wants to change pt from benicar to losartan/ hctz.  Form is on your desk. Initial call taken by: Lowella Petties CMA,  June 16, 2009 12:10 PM     Appended Document: pharmacy wants to change benicar [facility] [phone]

## 2010-07-15 ENCOUNTER — Other Ambulatory Visit: Payer: Self-pay | Admitting: Family Medicine

## 2010-07-30 ENCOUNTER — Encounter: Payer: Self-pay | Admitting: *Deleted

## 2010-08-03 ENCOUNTER — Ambulatory Visit: Payer: Self-pay | Admitting: Family Medicine

## 2010-09-11 NOTE — Cardiovascular Report (Signed)
Dana Rogers, Dana Rogers                         ACCOUNT NO.:  [id number]   MEDICAL RECORD NO.:  [id number]                   PATIENT TYPE:  OIB   LOCATION:  2860                                 FACILITY:  [facility]   PHYSICIAN:  Veneda Melter, M.D.                   DATE OF BIRTH:  25-Nov-1955   DATE OF PROCEDURE:  12/17/2002  DATE OF DISCHARGE:                              CARDIAC CATHETERIZATION   PROCEDURES PERFORMED:  1. Left heart catheterization.  2. Left ventriculogram.  3. Selective coronary angiography.   DIAGNOSES:  1. Trivial coronary artery disease by angiogram.  2. Normal left ventricular systolic function.   HISTORY:  Dana Rogers is a 55 year old black female who presents with  substernal chest discomfort that has been increasing in frequency and  severity.  The patient has a history of hypertension and mild glucose  intolerance as well as a strong family history of heart disease and  hypertension.  She presents for further cardiac assessment.   TECHNIQUE:  Informed consent was obtained.  The patient was brought to the  catheterization laboratory.  A 6-French sheath placed in the right femoral  artery using modified Seldinger technique.  A 6-French JL4 and JR4 catheter  was then used to engage the left and right coronary arteries.  Selective  angiography performed in various projections using manual injections of  contrast.  A 6-French pigtail catheter was advanced in the left ventricle  and a left ventriculogram performed using power injections of contrast.  At  the termination of this case catheters and sheath were removed and a  Perclose suture closure device deployed to the right femoral artery until  adequate hemostasis achieved.  The patient tolerated procedure well and was  transferred to floor in stable condition.   FINDINGS:  1. Left main trunk:  Large caliber vessel.  Angiographically normal.  2. LAD is a medium caliber vessel that provides three diagonal  branches.     The LAD has luminal irregularities.  3. Left circumflex artery is a large caliber vessel and provides three     marginal branches.  The left circumflex system has luminal     irregularities.  4. Right coronary artery is dominant.  This is a medium caliber vessel that     provides a small posterior descending artery and a posterior ventricular     branch in the terminal segment.  The right coronary has luminal     irregularities  5. LV:  Normal end-systolic and end-diastolic dimensions.  Overall left     ventricular function is well preserved.  Ejection fraction greater than     65%.  There is no mitral regurgitation.  LV pressure is 180/5.  Aortic is     180/100.  LVEDP equals 15.    ASSESSMENT/PLAN:  Dana Rogers is a 55 year old female who presents with  crescendo chest discomfort of unclear etiology.  She  has trivial coronary  artery disease.  At this point aggressive medical therapy will be pursued  regarding her blood pressure and other cause of chest pain investigated.                                               Veneda Melter, M.D.    NG/MEDQ  D:  12/17/2002  T:  12/17/2002  Job:  [id number]   cc:   Rosalyn Gess. Norins, M.D. [facility]

## 2010-09-15 ENCOUNTER — Other Ambulatory Visit: Payer: Self-pay | Admitting: Family Medicine

## 2010-10-14 ENCOUNTER — Other Ambulatory Visit: Payer: Self-pay | Admitting: Family Medicine

## 2010-11-10 ENCOUNTER — Ambulatory Visit (INDEPENDENT_AMBULATORY_CARE_PROVIDER_SITE_OTHER): Payer: BC Managed Care – PPO | Admitting: Family Medicine

## 2010-11-10 ENCOUNTER — Encounter: Payer: Self-pay | Admitting: Family Medicine

## 2010-11-10 VITALS — BP 128/80 | HR 58 | Temp 98.7°F | Wt 180.0 lb

## 2010-11-10 DIAGNOSIS — J069 Acute upper respiratory infection, unspecified: Secondary | ICD-10-CM

## 2010-11-10 NOTE — Progress Notes (Signed)
  Subjective:    Patient ID: Dana Rogers, female    DOB: Apr 20, 1956, 55 y.o.   MRN: [id number]  HPI Comments: Non. smoker,   NO history of COPD, asthma  Sore Throat  This is a new problem. The current episode started in the past 7 days. The problem has been gradually improving. There has been no fever (chills). Associated symptoms include coughing, headaches and trouble swallowing. Pertinent negatives include no ear discharge, ear pain, hoarse voice, neck pain, shortness of breath, swollen glands or vomiting. Associated symptoms comments: Sore thrat and chills improved some now but more cough and congestion Cough not keeping her up at night. No sinus pain. She has had no exposure to strep or mono. She has tried nothing (cough drops) for the symptoms.      Review of Systems  HENT: Positive for trouble swallowing. Negative for ear pain, hoarse voice, neck pain and ear discharge.   Respiratory: Positive for cough. Negative for shortness of breath.   Gastrointestinal: Negative for vomiting.  Neurological: Positive for headaches.       Objective:   Physical Exam  Constitutional: Vital signs are normal. She appears well-developed and well-nourished. She is cooperative.  Non-toxic appearance. She does not appear ill. No distress.  HENT:  Head: Normocephalic.  Right Ear: Hearing, tympanic membrane, external ear and ear canal normal. Tympanic membrane is not erythematous, not retracted and not bulging.  Left Ear: Hearing, tympanic membrane, external ear and ear canal normal. Tympanic membrane is not erythematous, not retracted and not bulging.  Nose: Mucosal edema and rhinorrhea present. Right sinus exhibits no maxillary sinus tenderness and no frontal sinus tenderness. Left sinus exhibits no maxillary sinus tenderness and no frontal sinus tenderness.  Mouth/Throat: Uvula is midline, oropharynx is clear and moist and mucous membranes are normal.  Eyes: Conjunctivae, EOM and lids are normal.  Pupils are equal, round, and reactive to light. No foreign bodies found.  Neck: Trachea normal and normal range of motion. Neck supple. Carotid bruit is not present. No mass and no thyromegaly present.  Cardiovascular: Normal rate, regular rhythm, S1 normal, S2 normal, normal heart sounds, intact distal pulses and normal pulses.  Exam reveals no gallop and no friction rub.   No murmur heard. Pulmonary/Chest: Effort normal and breath sounds normal. Not tachypneic. No respiratory distress. She has no decreased breath sounds. She has no wheezes. She has no rhonchi. She has no rales.  Genitourinary: Vagina normal and uterus normal.  Neurological: She is alert.  Skin: Skin is warm, dry and intact. No rash noted.  Psychiatric: Her speech is normal and behavior is normal. Judgment normal. Her mood appears not anxious. Cognition and memory are normal. She does not exhibit a depressed mood.          Assessment & Plan:

## 2010-11-10 NOTE — Assessment & Plan Note (Signed)
Symptomatic care 

## 2010-11-10 NOTE — Patient Instructions (Signed)
Mucinex DM, no decongestant. Sinus irrigation.. Nasal saline 2-3 times a day. Expect symtpoms to continue improving but not be resolved for at least 4-5 more days. Call if fever (>101.4)  or if shortness of breath or not improving  as expected.

## 2010-11-27 ENCOUNTER — Telehealth: Payer: Self-pay | Admitting: *Deleted

## 2010-11-27 MED ORDER — AZITHROMYCIN 250 MG PO TABS: ORAL_TABLET | ORAL | Status: AC

## 2010-11-27 NOTE — Telephone Encounter (Signed)
Patient stated that she is still coughing, no SOB, no wheezing, she sounds hoarse over the phone.  No fever.

## 2010-11-27 NOTE — Telephone Encounter (Signed)
Let pt know.. Sent in Z-pak. Call if not improving afterseveral days after completed antibiotic.

## 2010-11-27 NOTE — Telephone Encounter (Signed)
Pt states she was seen for a sore throat and was told to call if she wasn't any better.  She is not, throat is still very sore.  Uses [facility] in [place]

## 2010-11-27 NOTE — Telephone Encounter (Signed)
Please call to finds out pt symptoms.. Still cough or just sore throat? Any SOB?

## 2010-11-27 NOTE — Telephone Encounter (Signed)
Nurse left for day. I called pt to notify her Rx sent in.

## 2010-12-10 ENCOUNTER — Telehealth: Payer: Self-pay | Admitting: Family Medicine

## 2010-12-10 DIAGNOSIS — E785 Hyperlipidemia, unspecified: Secondary | ICD-10-CM

## 2010-12-10 DIAGNOSIS — E119 Type 2 diabetes mellitus without complications: Secondary | ICD-10-CM

## 2010-12-10 NOTE — Telephone Encounter (Signed)
Message copied by Excell Seltzer on Thu Dec 10, 2010  5:40 PM ------      Message from: Alvina Chou      Created: Wed Dec 02, 2010 12:36 PM       Patient is scheduled for CPX labs,Friday 8-17,  please order future labs, Thanks , Camelia Eng

## 2010-12-11 ENCOUNTER — Other Ambulatory Visit: Payer: BC Managed Care – PPO

## 2010-12-18 ENCOUNTER — Encounter: Payer: Self-pay | Admitting: Family Medicine

## 2010-12-18 ENCOUNTER — Encounter: Payer: Self-pay | Admitting: Internal Medicine

## 2010-12-18 ENCOUNTER — Ambulatory Visit (INDEPENDENT_AMBULATORY_CARE_PROVIDER_SITE_OTHER): Payer: PRIVATE HEALTH INSURANCE | Admitting: Family Medicine

## 2010-12-18 DIAGNOSIS — E785 Hyperlipidemia, unspecified: Secondary | ICD-10-CM

## 2010-12-18 DIAGNOSIS — E119 Type 2 diabetes mellitus without complications: Secondary | ICD-10-CM

## 2010-12-18 DIAGNOSIS — Z1211 Encounter for screening for malignant neoplasm of colon: Secondary | ICD-10-CM

## 2010-12-18 DIAGNOSIS — Z01419 Encounter for gynecological examination (general) (routine) without abnormal findings: Secondary | ICD-10-CM

## 2010-12-18 DIAGNOSIS — I10 Essential (primary) hypertension: Secondary | ICD-10-CM

## 2010-12-18 DIAGNOSIS — Z0000 Encounter for general adult medical examination without abnormal findings: Secondary | ICD-10-CM

## 2010-12-18 NOTE — Patient Instructions (Addendum)
Schedule fasting labs in the next week.  Also follow up DM in 3 months with labs prior. Stop at the front desk to speak with Shirlee Limerick about colonoscopy scheduling and GYN referral.

## 2010-12-18 NOTE — Assessment & Plan Note (Signed)
Due for re-eval. 

## 2010-12-18 NOTE — Assessment & Plan Note (Signed)
UNclear control, check A1C.

## 2010-12-18 NOTE — Assessment & Plan Note (Signed)
Almost at goal <130/80 which is great for her. Continue current regimen. Encouraged exercise, weight loss, healthy eating habits.

## 2010-12-18 NOTE — Progress Notes (Signed)
Subjective:    Patient ID: MAYO OWCZARZAK, female    DOB: [birth year], 55 y.o.   MRN: [id number]  HPI  Hypertension:  Almost at goal  <130/80 today on hyzaar, norvasc and altace. Using medication without problems or lightheadedness:  Chest pain with exertion: None Edema:None Short of breath:None Average home BPs:130/80, checks occasionally. Other issues:  Elevated Cholesterol: Previously well controlled on zocor 40 mg daily. Last check 08/2009 Using medications without problems: none Muscle aches: None Other complaints:  Diabetes:  Poor control last check 08/2009, A1C 8.8. On glipizide and metformin. Using medications without difficulties: Yes Hypoglycemic episodes:None Hyperglycemic episodes:None Feet problems:None Blood Sugars averaging: Rarely. eye exam within last year:  No GYN issues, she just wants to see GYN for pelvic and breast exam issues.   Review of Systems  Constitutional: Negative for fever and fatigue.  HENT: Negative for ear pain.   Eyes: Negative for pain.  Respiratory: Negative for chest tightness and shortness of breath.   Cardiovascular: Negative for chest pain, palpitations and leg swelling.  Gastrointestinal: Negative for abdominal pain.  Genitourinary: Negative for dysuria.       Objective:   Physical Exam  Constitutional: Vital signs are normal. She appears well-developed and well-nourished. She is cooperative.  Non-toxic appearance. She does not appear ill. No distress.  HENT:  Head: Normocephalic.  Right Ear: Hearing, tympanic membrane, external ear and ear canal normal. Tympanic membrane is not erythematous, not retracted and not bulging.  Left Ear: Hearing, tympanic membrane, external ear and ear canal normal. Tympanic membrane is not erythematous, not retracted and not bulging.  Nose: No mucosal edema or rhinorrhea. Right sinus exhibits no maxillary sinus tenderness and no frontal sinus tenderness. Left sinus exhibits no maxillary sinus  tenderness and no frontal sinus tenderness.  Mouth/Throat: Uvula is midline, oropharynx is clear and moist and mucous membranes are normal.  Eyes: Conjunctivae, EOM and lids are normal. Pupils are equal, round, and reactive to light. No foreign bodies found.  Neck: Trachea normal and normal range of motion. Neck supple. Carotid bruit is not present. No mass and no thyromegaly present.  Cardiovascular: Normal rate, regular rhythm, S1 normal, S2 normal, normal heart sounds, intact distal pulses and normal pulses.  Exam reveals no gallop and no friction rub.   No murmur heard. Pulmonary/Chest: Effort normal and breath sounds normal. Not tachypneic. No respiratory distress. She has no decreased breath sounds. She has no wheezes. She has no rhonchi. She has no rales.  Abdominal: Soft. Normal appearance and bowel sounds are normal. There is no tenderness.  Neurological: She is alert.  Skin: Skin is warm, dry and intact. No rash noted.  Psychiatric: She has a normal mood and affect. Her speech is normal and behavior is normal. Judgment and thought content normal. Her mood appears not anxious. Cognition and memory are normal. She does not exhibit a depressed mood.   Diabetic foot exam: Normal inspection No skin breakdown No calluses  Normal DP pulses Normal sensation to light touch and monofilament Nails normal        Assessment & Plan:     The patient's preventative maintenance and recommended screening tests for an annual wellness exam were reviewed in full today. Brought up to date unless services declined.  Counselled on the importance of diet, exercise, and its role in overall health and mortality. The patient's FH and SH was reviewed, including their home life, tobacco status, and drug and alcohol status.   Last PAP nml 2010.. wishes  to see GYN Last mammo 2010.. wishes to see GYN Colon cancer screening , never scheduled in 2010... Vaccines up to date, PNA and Tdap UpTo  Date.

## 2010-12-23 ENCOUNTER — Other Ambulatory Visit: Payer: Self-pay | Admitting: Family Medicine

## 2010-12-23 ENCOUNTER — Other Ambulatory Visit: Payer: PRIVATE HEALTH INSURANCE

## 2011-01-04 ENCOUNTER — Ambulatory Visit (AMBULATORY_SURGERY_CENTER): Payer: PRIVATE HEALTH INSURANCE | Admitting: *Deleted

## 2011-01-04 VITALS — Ht 67.0 in | Wt 177.8 lb

## 2011-01-04 DIAGNOSIS — Z1211 Encounter for screening for malignant neoplasm of colon: Secondary | ICD-10-CM

## 2011-01-04 MED ORDER — PEG-KCL-NACL-NASULF-NA ASC-C 100 G PO SOLR: ORAL | Status: DC

## 2011-01-18 ENCOUNTER — Telehealth: Payer: Self-pay | Admitting: *Deleted

## 2011-01-18 ENCOUNTER — Other Ambulatory Visit: Payer: PRIVATE HEALTH INSURANCE | Admitting: Internal Medicine

## 2011-01-18 LAB — POCT I-STAT, CHEM 8
Chloride: 103
Creatinine, Ser: 1.2
Glucose, Bld: 87
HCT: 38
Potassium: 3.3 — ABNORMAL LOW
Sodium: 141

## 2011-01-18 LAB — POCT CARDIAC MARKERS
CKMB, poc: 2.2
Myoglobin, poc: 69.9
Operator id: 151321
Troponin i, poc: <0.05

## 2011-01-18 LAB — D-DIMER, QUANTITATIVE: D-Dimer, Quant: 0.36

## 2011-01-18 NOTE — Telephone Encounter (Signed)
Message copied by Florene Glen on Mon Jan 18, 2011 11:15 AM ------      Message from: Beverley Fiedler      Created: Mon Jan 18, 2011  8:19 AM      Regarding: Prep problems       Baldo Ash got a call from this lady last pm and she was unable to complete her prep.      Could you contact her, triage, and try to reschedule.        We could offer Miralax prep with Gatorade??      Thanks

## 2011-01-18 NOTE — Telephone Encounter (Signed)
lmom for pt to call back to speak to her about her prep.

## 2011-01-19 NOTE — Telephone Encounter (Signed)
Spoke with pt this am and she stated she was unable to complete the 1st bottle of Movi Prep and she reports what she did drink had no results. Informed her Dr Rhea Belton suggested the Miralax prep with Gatorade, but again it's a large volume to drink. I offered her SUPREP because of a smaller volume of prep followed by an increased amount of plain water. Pt will think over the preps and call back to r/s.

## 2011-02-19 NOTE — Telephone Encounter (Signed)
Called pt back since I haven't heard from her in over a month, lmom for her to call back.

## 2011-02-24 NOTE — Telephone Encounter (Signed)
Mailed pt a letter requesting her to call to r/s her COLON.

## 2011-03-23 ENCOUNTER — Other Ambulatory Visit (INDEPENDENT_AMBULATORY_CARE_PROVIDER_SITE_OTHER): Payer: PRIVATE HEALTH INSURANCE

## 2011-03-23 DIAGNOSIS — E7800 Pure hypercholesterolemia, unspecified: Secondary | ICD-10-CM

## 2011-03-23 DIAGNOSIS — E119 Type 2 diabetes mellitus without complications: Secondary | ICD-10-CM

## 2011-03-23 LAB — COMPREHENSIVE METABOLIC PANEL
AST: 19 U/L (ref 0–37)
Albumin: 4.5 g/dL (ref 3.5–5.2)
Alkaline Phosphatase: 54 U/L (ref 39–117)
BUN: 32 mg/dL — ABNORMAL HIGH (ref 6–23)
Calcium: 10.4 mg/dL (ref 8.4–10.5)
Creat: 1.56 mg/dL — ABNORMAL HIGH (ref 0.50–1.10)
Glucose, Bld: 100 mg/dL — ABNORMAL HIGH (ref 70–99)

## 2011-03-23 LAB — LIPID PANEL
HDL: 29 mg/dL — ABNORMAL LOW (ref >39)
LDL Cholesterol: 73 mg/dL (ref 0–99)
Total CHOL/HDL Ratio: 4.4 Ratio
Triglycerides: 137 mg/dL (ref <150)

## 2011-03-23 LAB — HEMOGLOBIN A1C: Hgb A1c MFr Bld: 7.3 % — ABNORMAL HIGH (ref <5.7)

## 2011-03-25 ENCOUNTER — Encounter: Payer: Self-pay | Admitting: Family Medicine

## 2011-03-25 ENCOUNTER — Ambulatory Visit (INDEPENDENT_AMBULATORY_CARE_PROVIDER_SITE_OTHER): Payer: PRIVATE HEALTH INSURANCE | Admitting: Family Medicine

## 2011-03-25 DIAGNOSIS — E119 Type 2 diabetes mellitus without complications: Secondary | ICD-10-CM

## 2011-03-25 DIAGNOSIS — E785 Hyperlipidemia, unspecified: Secondary | ICD-10-CM

## 2011-03-25 DIAGNOSIS — R519 Headache, unspecified: Secondary | ICD-10-CM | POA: Insufficient documentation

## 2011-03-25 DIAGNOSIS — R51 Headache: Secondary | ICD-10-CM | POA: Insufficient documentation

## 2011-03-25 DIAGNOSIS — I10 Essential (primary) hypertension: Secondary | ICD-10-CM

## 2011-03-25 MED ORDER — SIMVASTATIN 40 MG PO TABS: 40.0000 mg | ORAL_TABLET | Freq: Every day | ORAL | Status: DC

## 2011-03-25 MED ORDER — RAMIPRIL 10 MG PO TABS: 20.0000 mg | ORAL_TABLET | Freq: Every day | ORAL | Status: DC

## 2011-03-25 NOTE — Assessment & Plan Note (Signed)
Poor control. Cannot increase amlodipine or metoprolol given HR. INcrease ramipril to 20 mg daily.  If not improving .. eval for secondary cause... Consider referral to cards for help with treatment of malignant HTN.

## 2011-03-25 NOTE — Patient Instructions (Addendum)
Increase ramipril to 2 tablets daily. Follow BP daily at home.. Call if not <130/80 in 1-2 weeks.  Call with the measurements. Work on regular exercise.Marland Kitchen 3-5 times a week.  Work on low [employer].

## 2011-03-25 NOTE — Assessment & Plan Note (Signed)
Improved control but not yet at goal. She will add exercsie and work aggressively. If not at goal next OV.. Add Januvia.

## 2011-03-25 NOTE — Assessment & Plan Note (Signed)
Well controlled. Continue current medication.  

## 2011-03-25 NOTE — Assessment & Plan Note (Signed)
Nml neuro exam. Likely due to elevated BPs. If not improved with improvement of BP consider further eval.

## 2011-03-25 NOTE — Progress Notes (Signed)
Subjective:    Patient ID: Dana Rogers, female    DOB: 03/01/1956, 55 y.o.   MRN: [id number]  HPI  55 year old female here for 3 month follow up.   Hypertension: Elevated above goal <130/80 at today's OV. On metoprolol 50, hyzaar , ramipril 10, amlodipine 5 mg daily Using medication without problems or lightheadedness:  occ Chest pain with exertion:None Edema:None Short of breath:mild over past few years Average home BPs:  Occ checking  200/110 in last week.  Other issues: Compliant with medications.  Elevated Cholesterol: At goal LDL <100 on simvastatin. Using medications without problems:None Muscle aches: None Diet compliance:Moderate Exercise:None Other complaints:  Diabetes:  Improved with addition of glipizide but not at goal on low dose metformin and max glipizide. Did not tolerate higher dose of metformin Lab Results  Component Value Date   HGBA1C 7.3* 03/23/2011   Using medications without difficulties:None Hypoglycemic episodes:None Hyperglycemic episodes: occ in last month Feet problems: Soreness if wearing high heels in ball of foot Blood Sugars averaging: Checking once daily, FBS: 101-181,  2 hours postprandial: not checking eye exam within last year:yes  Sharp pain above left eye intermittently for past 3 weeks. Lasts 10 min. No vision change. Last eye exam 6 months ago. Occ congestion, mild, o sneeze, no itchy eyes, no sinus pressure.      Review of Systems     Objective:   Physical Exam  Constitutional: Vital signs are normal. She appears well-developed and well-nourished. She is cooperative.  Non-toxic appearance. She does not appear ill. No distress.  HENT:  Head: Normocephalic.  Right Ear: Hearing, tympanic membrane, external ear and ear canal normal. Tympanic membrane is not erythematous, not retracted and not bulging.  Left Ear: Hearing, tympanic membrane, external ear and ear canal normal. Tympanic membrane is not erythematous, not  retracted and not bulging.  Nose: No mucosal edema or rhinorrhea. Right sinus exhibits no maxillary sinus tenderness and no frontal sinus tenderness. Left sinus exhibits no maxillary sinus tenderness and no frontal sinus tenderness.  Mouth/Throat: Uvula is midline, oropharynx is clear and moist and mucous membranes are normal.  Eyes: Conjunctivae, EOM and lids are normal. Pupils are equal, round, and reactive to light. No foreign bodies found.       No current tenderness... Does not report eye pain, but really headache above eye intermittantly, none now.   Neck: Trachea normal and normal range of motion. Neck supple. Carotid bruit is not present. No mass and no thyromegaly present.  Cardiovascular: Normal rate, regular rhythm, S1 normal, S2 normal, normal heart sounds, intact distal pulses and normal pulses.  Exam reveals no gallop and no friction rub.   No murmur heard. Pulmonary/Chest: Effort normal and breath sounds normal. Not tachypneic. No respiratory distress. She has no decreased breath sounds. She has no wheezes. She has no rhonchi. She has no rales.  Abdominal: Soft. Normal appearance and bowel sounds are normal. There is no tenderness.  Neurological: She is alert. She has normal strength and normal reflexes. No cranial nerve deficit or sensory deficit. She displays a negative Romberg sign.  Skin: Skin is warm, dry and intact. No rash noted.  Psychiatric: Her speech is normal and behavior is normal. Judgment and thought content normal. Her mood appears not anxious. Cognition and memory are normal. She does not exhibit a depressed mood.   Diabetic foot exam: Normal inspection No skin breakdown No calluses  Normal DP pulses Normal sensation to light touch and monofilament Nails normal  Assessment & Plan:

## 2011-05-27 ENCOUNTER — Other Ambulatory Visit: Payer: PRIVATE HEALTH INSURANCE

## 2011-06-03 ENCOUNTER — Ambulatory Visit: Payer: PRIVATE HEALTH INSURANCE | Admitting: Family Medicine

## 2011-08-12 ENCOUNTER — Other Ambulatory Visit: Payer: Self-pay | Admitting: *Deleted

## 2011-08-12 MED ORDER — AMLODIPINE BESYLATE 5 MG PO TABS: 5.0000 mg | ORAL_TABLET | Freq: Every day | ORAL | Status: DC

## 2011-08-12 MED ORDER — LOSARTAN POTASSIUM-HCTZ 100-25 MG PO TABS: 1.0000 | ORAL_TABLET | Freq: Every day | ORAL | Status: DC

## 2011-08-24 ENCOUNTER — Other Ambulatory Visit: Payer: Self-pay | Admitting: *Deleted

## 2011-08-24 MED ORDER — METFORMIN HCL ER 500 MG PO TB24: 500.0000 mg | ORAL_TABLET | Freq: Every day | ORAL | Status: DC

## 2011-08-30 ENCOUNTER — Other Ambulatory Visit (INDEPENDENT_AMBULATORY_CARE_PROVIDER_SITE_OTHER): Payer: PRIVATE HEALTH INSURANCE

## 2011-08-30 DIAGNOSIS — E119 Type 2 diabetes mellitus without complications: Secondary | ICD-10-CM

## 2011-08-30 NOTE — Progress Notes (Signed)
Addended by: Liane Comber C on: 08/30/2011 12:30 PM   Modules accepted: Orders

## 2011-08-31 LAB — HEMOGLOBIN A1C
Hgb A1c MFr Bld: 7.2 % — ABNORMAL HIGH (ref <5.7)
Mean Plasma Glucose: 160 mg/dL — ABNORMAL HIGH (ref <117)

## 2011-09-03 ENCOUNTER — Ambulatory Visit: Payer: PRIVATE HEALTH INSURANCE | Admitting: Family Medicine

## 2011-09-13 ENCOUNTER — Telehealth: Payer: Self-pay | Admitting: Family Medicine

## 2011-09-13 NOTE — Telephone Encounter (Signed)
Caller: Dana Rogers; PCP: Kerby Nora E.; CB#: [phone]; Call regarding Back Pain; R lower back pain onset 08/29/11 that has increased today due to personal stress. Hx back pain. No known injury. Appt sched for 09/14/11 @ 1045 with Dr. Ermalene Searing. Back Pain Protocol. Call back parameters reviewed.

## 2011-09-14 ENCOUNTER — Encounter: Payer: Self-pay | Admitting: Family Medicine

## 2011-09-14 ENCOUNTER — Ambulatory Visit (INDEPENDENT_AMBULATORY_CARE_PROVIDER_SITE_OTHER): Payer: PRIVATE HEALTH INSURANCE | Admitting: Family Medicine

## 2011-09-14 VITALS — BP 140/90 | HR 65 | Temp 98.2°F | Ht 67.0 in | Wt 172.8 lb

## 2011-09-14 DIAGNOSIS — M79609 Pain in unspecified limb: Secondary | ICD-10-CM

## 2011-09-14 DIAGNOSIS — E119 Type 2 diabetes mellitus without complications: Secondary | ICD-10-CM

## 2011-09-14 DIAGNOSIS — M79674 Pain in right toe(s): Secondary | ICD-10-CM | POA: Insufficient documentation

## 2011-09-14 DIAGNOSIS — M549 Dorsalgia, unspecified: Secondary | ICD-10-CM

## 2011-09-14 LAB — HM DIABETES FOOT EXAM

## 2011-09-14 MED ORDER — DICLOFENAC SODIUM 75 MG PO TBEC: 75.0000 mg | DELAYED_RELEASE_TABLET | Freq: Two times a day (BID) | ORAL | Status: DC

## 2011-09-14 MED ORDER — CYCLOBENZAPRINE HCL 10 MG PO TABS: 10.0000 mg | ORAL_TABLET | Freq: Every evening | ORAL | Status: DC | PRN

## 2011-09-14 NOTE — Assessment & Plan Note (Signed)
Improving control with recent diet changes on glucophage and glipizide. Continue lifestyle changes

## 2011-09-14 NOTE — Progress Notes (Signed)
Subjective:    Patient ID: Dana Rogers, female    DOB: [birth year], 56 y.o.   MRN: [id number]  HPI   56 year old female with history of back pain presents with 2 weeks of sudden onset back pain. No known injury, no falls. Pain in right lower back, no radiation. Area of pain felt slightly "puffy" the other day. Pain increased with bending over, walking, moving legs. No weakness, no numbness. No fever. No dysuria, no blood in urine. Has been using tylenol arthriitis, mild relief, helped her sleep at night.  Also has chronic toe pain in right 5th digit, hard, presses on shoe.. Wants referral to podiatrist given her DM.  Diabetes:  Improved control with diet change. A1C 7.2 She has decreased carbs in diet. On glucophage and glucotrol. Using medications without difficulties: Yes Hypoglycemic episodes:None Hyperglycemic episodes:None Feet problems:Yes , see above Blood Sugars averaging: occ checking FBS 90s eye exam within last year: last 08/2010      Review of Systems  Constitutional: Negative for fever and fatigue.  HENT: Negative for ear pain.   Eyes: Negative for pain.  Respiratory: Negative for chest tightness and shortness of breath.   Cardiovascular: Negative for chest pain, palpitations and leg swelling.  Gastrointestinal: Negative for abdominal pain.  Genitourinary: Negative for dysuria.  Musculoskeletal: Positive for back pain.       Objective:   Physical Exam  Constitutional: Vital signs are normal. She appears well-developed and well-nourished. She is cooperative.  Non-toxic appearance. She does not appear ill. No distress.  HENT:  Head: Normocephalic.  Right Ear: Hearing, tympanic membrane, external ear and ear canal normal. Tympanic membrane is not erythematous, not retracted and not bulging.  Left Ear: Hearing, tympanic membrane, external ear and ear canal normal. Tympanic membrane is not erythematous, not retracted and not bulging.  Nose: No mucosal edema or  rhinorrhea. Right sinus exhibits no maxillary sinus tenderness and no frontal sinus tenderness. Left sinus exhibits no maxillary sinus tenderness and no frontal sinus tenderness.  Mouth/Throat: Uvula is midline, oropharynx is clear and moist and mucous membranes are normal.  Eyes: Conjunctivae, EOM and lids are normal. Pupils are equal, round, and reactive to light. No foreign bodies found.  Neck: Trachea normal and normal range of motion. Neck supple. Carotid bruit is not present. No mass and no thyromegaly present.  Cardiovascular: Normal rate, regular rhythm, S1 normal, S2 normal, normal heart sounds, intact distal pulses and normal pulses.  Exam reveals no gallop and no friction rub.   No murmur heard. Pulmonary/Chest: Effort normal and breath sounds normal. Not tachypneic. No respiratory distress. She has no decreased breath sounds. She has no wheezes. She has no rhonchi. She has no rales.  Abdominal: Soft. Normal appearance and bowel sounds are normal. There is no tenderness.  Musculoskeletal:       Lumbar back: She exhibits decreased range of motion, tenderness and spasm. She exhibits no bony tenderness and no swelling.       ttp over right paraspinous muscles, pain with flexion and extension, neg SLR   Neurological: She is alert.  Skin: Skin is warm, dry and intact. No rash noted.  Psychiatric: Her speech is normal and behavior is normal. Judgment and thought content normal. Her mood appears not anxious. Cognition and memory are normal. She does not exhibit a depressed mood.         Foot exam: nml monofilament, no calluses, hammer toe in 5th digit on right Assessment & Plan:

## 2011-09-14 NOTE — Assessment & Plan Note (Signed)
Refer to podiatry for hammer toe treatment options given pain .

## 2011-09-14 NOTE — Patient Instructions (Addendum)
Keep working on diet changes and weight loss to low blood sugars. Continue current meds. Stop at front desk for podiatry referral. For back pain: treat with diclofenac for anti inflammatory, use muscle relaxant as need at bedtime, gentle stretching, heat, massage.  Call if not improving in 2 weeks. Follow up DM in 6 months with fasting labs prior.

## 2011-09-14 NOTE — Assessment & Plan Note (Signed)
No sign of radiculopathy or vetebral tenderness. No current indication for X-ray.  Treat with diclofenac for anti inflammatory, use muscle relaxant as need at bedtime, gentle stretching, heat, massage.  Call if not improving in 2 weeks.

## 2011-09-16 ENCOUNTER — Ambulatory Visit: Payer: PRIVATE HEALTH INSURANCE | Admitting: Family Medicine

## 2011-10-18 ENCOUNTER — Other Ambulatory Visit: Payer: Self-pay | Admitting: Family Medicine

## 2011-11-09 ENCOUNTER — Other Ambulatory Visit: Payer: Self-pay | Admitting: Family Medicine

## 2011-11-09 ENCOUNTER — Other Ambulatory Visit: Payer: Self-pay | Admitting: *Deleted

## 2011-11-09 MED ORDER — SIMVASTATIN 40 MG PO TABS: 40.0000 mg | ORAL_TABLET | Freq: Every day | ORAL | Status: DC

## 2012-03-03 ENCOUNTER — Telehealth: Payer: Self-pay | Admitting: Family Medicine

## 2012-03-03 DIAGNOSIS — I10 Essential (primary) hypertension: Secondary | ICD-10-CM

## 2012-03-03 DIAGNOSIS — E785 Hyperlipidemia, unspecified: Secondary | ICD-10-CM

## 2012-03-03 DIAGNOSIS — E119 Type 2 diabetes mellitus without complications: Secondary | ICD-10-CM

## 2012-03-03 NOTE — Telephone Encounter (Signed)
Message copied by Excell Seltzer on Fri Mar 03, 2012  2:48 PM ------      Message from: Baldomero Lamy      Created: Mon Feb 28, 2012 10:18 AM      Regarding: 6 mo f/u labs 03/09/2012       Please order  future f/u labs for pt's upcomming lab appt.      Thanks      Rodney Booze

## 2012-03-03 NOTE — Telephone Encounter (Signed)
Message copied by Excell Seltzer on Fri Mar 03, 2012  2:49 PM ------      Message from: Baldomero Lamy      Created: Mon Feb 28, 2012 10:18 AM      Regarding: 6 mo f/u labs 03/09/2012       Please order  future f/u labs for pt's upcomming lab appt.      Thanks      Rodney Booze

## 2012-03-09 ENCOUNTER — Other Ambulatory Visit (INDEPENDENT_AMBULATORY_CARE_PROVIDER_SITE_OTHER): Payer: PRIVATE HEALTH INSURANCE

## 2012-03-09 DIAGNOSIS — E119 Type 2 diabetes mellitus without complications: Secondary | ICD-10-CM

## 2012-03-09 DIAGNOSIS — E785 Hyperlipidemia, unspecified: Secondary | ICD-10-CM

## 2012-03-09 DIAGNOSIS — I10 Essential (primary) hypertension: Secondary | ICD-10-CM

## 2012-03-09 LAB — COMPREHENSIVE METABOLIC PANEL
ALT: 17 U/L (ref 0–35)
AST: 17 U/L (ref 0–37)
Alkaline Phosphatase: 65 U/L (ref 39–117)
BUN: 20 mg/dL (ref 6–23)
Calcium: 10 mg/dL (ref 8.4–10.5)
Chloride: 101 mEq/L (ref 96–112)
Creat: 1.05 mg/dL (ref 0.50–1.10)
Potassium: 3.6 mEq/L (ref 3.5–5.3)

## 2012-03-09 LAB — LIPID PANEL
HDL: 27 mg/dL — ABNORMAL LOW (ref >39)
LDL Cholesterol: 89 mg/dL (ref 0–99)
Total CHOL/HDL Ratio: 5.1 Ratio

## 2012-03-10 LAB — HEMOGLOBIN A1C
Hgb A1c MFr Bld: 6.8 % — ABNORMAL HIGH (ref <5.7)
Mean Plasma Glucose: 148 mg/dL — ABNORMAL HIGH (ref <117)

## 2012-03-16 ENCOUNTER — Encounter: Payer: Self-pay | Admitting: Family Medicine

## 2012-03-16 ENCOUNTER — Ambulatory Visit (INDEPENDENT_AMBULATORY_CARE_PROVIDER_SITE_OTHER): Payer: PRIVATE HEALTH INSURANCE | Admitting: Family Medicine

## 2012-03-16 VITALS — BP 140/80 | HR 63 | Temp 98.2°F | Ht 67.0 in | Wt 169.5 lb

## 2012-03-16 DIAGNOSIS — E119 Type 2 diabetes mellitus without complications: Secondary | ICD-10-CM

## 2012-03-16 DIAGNOSIS — L819 Disorder of pigmentation, unspecified: Secondary | ICD-10-CM

## 2012-03-16 DIAGNOSIS — E785 Hyperlipidemia, unspecified: Secondary | ICD-10-CM

## 2012-03-16 DIAGNOSIS — I10 Essential (primary) hypertension: Secondary | ICD-10-CM

## 2012-03-16 NOTE — Assessment & Plan Note (Signed)
Well controlled despite only taking simvastatin once a week at best. Stay off and we will recheck chol in 6 months.

## 2012-03-16 NOTE — Assessment & Plan Note (Signed)
Well controlled. Continue current medication.  

## 2012-03-16 NOTE — Patient Instructions (Addendum)
Okay to remain off the simvastatin.  Work on exercise, weight loss, healthy eating habits. Schedule CPX without lab prior in next few months.

## 2012-03-16 NOTE — Progress Notes (Signed)
Subjective:    Patient ID: Dana Rogers, female    DOB: [birth year], 56 y.o.   MRN: [id number]  HPI 56 year old female presents for 6 month Dm follow up.  Has noted rash on arms and back x 1 month.. Pale circles. No blisters, no vesicles, not itchy. No family history of psoriasis or vitiligo.   Diabetes: Well controlled on metformin and glipizide Lab Results  Component Value Date   HGBA1C 6.8* 03/09/2012  Using medications without difficulties: Hypoglycemic episodes:None Hyperglycemic episodes:None Feet problems:None Blood Sugars averaging:100 eye exam within last year:No  Hypertension:  Borderline control on ramipril, hyzaar, amlodipine Using medication without problems or lightheadedness: None Chest pain with exertion:None Edema:None Short of breath:NOne Average home WUX:LKGMWNU <130/80 at home, occ 180/65 when stressed out. Other issues:  Elevated Cholesterol: Well controlled Not Taking statin Lab Results  Component Value Date   CHOL 139 03/09/2012   HDL 27* 03/09/2012   LDLCALC 89 03/09/2012   TRIG 114 03/09/2012   CHOLHDL 5.1 03/09/2012   Using medications without problems: Muscle aches: None Diet compliance: Has cut sweet tea. Exercise:None Other complaints:  8 lbs weight loss in last year. Wt Readings from Last 3 Encounters:  03/16/12 169 lb 8 oz (76.885 kg)  09/14/11 172 lb 12.8 oz (78.382 kg)  03/25/11 177 lb 6.4 oz (80.468 kg)      Review of Systems  Constitutional: Positive for unexpected weight change. Negative for fever and fatigue.  HENT: Negative for ear pain.   Eyes: Negative for pain.  Respiratory: Negative for chest tightness and shortness of breath.   Cardiovascular: Negative for chest pain, palpitations and leg swelling.  Gastrointestinal: Negative for abdominal pain.  Genitourinary: Negative for dysuria.  Skin: Positive for rash.       Objective:   Physical Exam  Constitutional: Vital signs are normal. She appears  well-developed and well-nourished. She is cooperative.  Non-toxic appearance. She does not appear ill. No distress.  HENT:  Head: Normocephalic.  Right Ear: Hearing, tympanic membrane, external ear and ear canal normal. Tympanic membrane is not erythematous, not retracted and not bulging.  Left Ear: Hearing, tympanic membrane, external ear and ear canal normal. Tympanic membrane is not erythematous, not retracted and not bulging.  Nose: No mucosal edema or rhinorrhea. Right sinus exhibits no maxillary sinus tenderness and no frontal sinus tenderness. Left sinus exhibits no maxillary sinus tenderness and no frontal sinus tenderness.  Mouth/Throat: Uvula is midline, oropharynx is clear and moist and mucous membranes are normal.  Eyes: Conjunctivae normal, EOM and lids are normal. Pupils are equal, round, and reactive to light. No foreign bodies found.  Neck: Trachea normal and normal range of motion. Neck supple. Carotid bruit is not present. No mass and no thyromegaly present.  Cardiovascular: Normal rate, regular rhythm, S1 normal, S2 normal, normal heart sounds, intact distal pulses and normal pulses.  Exam reveals no gallop and no friction rub.   No murmur heard. Pulmonary/Chest: Effort normal and breath sounds normal. Not tachypneic. No respiratory distress. She has no decreased breath sounds. She has no wheezes. She has no rhonchi. She has no rales.  Abdominal: Soft. Normal appearance and bowel sounds are normal. There is no tenderness.  Neurological: She is alert.  Skin: Skin is warm, dry and intact. No rash noted.       Pale macules on B arms, no flaky skin, no vesicles.  Psychiatric: Her speech is normal and behavior is normal. Judgment and thought content normal. Her mood  appears not anxious. Cognition and memory are normal. She does not exhibit a depressed mood.     Diabetic foot exam: Normal inspection No skin breakdown No calluses  Normal DP pulses Normal sensation to light touch  and monofilament Nails normal      Assessment & Plan:  Rash: refer to Derm . ? Fungal infeciton vs vitiligo vs other.

## 2012-03-16 NOTE — Assessment & Plan Note (Signed)
Borderline control on multiple meds. Follow at home.

## 2012-03-21 ENCOUNTER — Other Ambulatory Visit: Payer: Self-pay | Admitting: *Deleted

## 2012-03-21 MED ORDER — LOSARTAN POTASSIUM-HCTZ 100-25 MG PO TABS: 1.0000 | ORAL_TABLET | Freq: Every day | ORAL | Status: DC

## 2012-03-21 MED ORDER — AMLODIPINE BESYLATE 5 MG PO TABS: 5.0000 mg | ORAL_TABLET | Freq: Every day | ORAL | Status: DC

## 2012-04-19 ENCOUNTER — Other Ambulatory Visit: Payer: Self-pay | Admitting: Family Medicine

## 2012-06-16 ENCOUNTER — Other Ambulatory Visit (HOSPITAL_COMMUNITY)
Admission: RE | Admit: 2012-06-16 | Discharge: 2012-06-16 | Disposition: A | Discharge status: Home / Self Care | Payer: PRIVATE HEALTH INSURANCE | Source: Ambulatory Visit | Attending: Family Medicine | Admitting: Family Medicine

## 2012-06-16 ENCOUNTER — Encounter: Payer: Self-pay | Admitting: Family Medicine

## 2012-06-16 ENCOUNTER — Ambulatory Visit (INDEPENDENT_AMBULATORY_CARE_PROVIDER_SITE_OTHER): Payer: PRIVATE HEALTH INSURANCE | Admitting: Family Medicine

## 2012-06-16 VITALS — BP 140/78 | HR 70 | Temp 98.2°F | Ht 67.0 in | Wt 171.5 lb

## 2012-06-16 DIAGNOSIS — I10 Essential (primary) hypertension: Secondary | ICD-10-CM

## 2012-06-16 DIAGNOSIS — E785 Hyperlipidemia, unspecified: Secondary | ICD-10-CM

## 2012-06-16 DIAGNOSIS — Z8249 Family history of ischemic heart disease and other diseases of the circulatory system: Secondary | ICD-10-CM

## 2012-06-16 DIAGNOSIS — R87810 Cervical high risk human papillomavirus (HPV) DNA test positive: Secondary | ICD-10-CM | POA: Insufficient documentation

## 2012-06-16 DIAGNOSIS — E119 Type 2 diabetes mellitus without complications: Secondary | ICD-10-CM

## 2012-06-16 DIAGNOSIS — Z1151 Encounter for screening for human papillomavirus (HPV): Secondary | ICD-10-CM | POA: Insufficient documentation

## 2012-06-16 DIAGNOSIS — R5381 Other malaise: Secondary | ICD-10-CM | POA: Insufficient documentation

## 2012-06-16 DIAGNOSIS — Z1212 Encounter for screening for malignant neoplasm of rectum: Secondary | ICD-10-CM

## 2012-06-16 DIAGNOSIS — Z01419 Encounter for gynecological examination (general) (routine) without abnormal findings: Secondary | ICD-10-CM | POA: Insufficient documentation

## 2012-06-16 DIAGNOSIS — R5383 Other fatigue: Secondary | ICD-10-CM

## 2012-06-16 DIAGNOSIS — N9089 Other specified noninflammatory disorders of vulva and perineum: Secondary | ICD-10-CM | POA: Insufficient documentation

## 2012-06-16 DIAGNOSIS — Z0000 Encounter for general adult medical examination without abnormal findings: Secondary | ICD-10-CM

## 2012-06-16 DIAGNOSIS — R0789 Other chest pain: Secondary | ICD-10-CM

## 2012-06-16 LAB — CBC WITH DIFFERENTIAL/PLATELET
Basophils Absolute: 0 10*3/uL (ref 0.0–0.1)
Eosinophils Absolute: 0.2 10*3/uL (ref 0.0–0.7)
Hemoglobin: 12.3 g/dL (ref 12.0–15.0)
Lymphocytes Relative: 22.8 % (ref 12.0–46.0)
MCHC: 34.3 g/dL (ref 30.0–36.0)
Monocytes Relative: 10.3 % (ref 3.0–12.0)
Neutrophils Relative %: 61.5 % (ref 43.0–77.0)
Platelets: 167 10*3/uL (ref 150.0–400.0)
RDW: 13.4 % (ref 11.5–14.6)

## 2012-06-16 LAB — VITAMIN B12: Vitamin B-12: 308 pg/mL (ref 211–911)

## 2012-06-16 LAB — TSH: TSH: 0.77 u[IU]/mL (ref 0.35–5.50)

## 2012-06-16 MED ORDER — TRIAMCINOLONE ACETONIDE 0.1 % EX CREA: TOPICAL_CREAM | Freq: Two times a day (BID) | CUTANEOUS | Status: DC

## 2012-06-16 NOTE — Assessment & Plan Note (Signed)
Excellent control on glipizide and metformin with changes in diet. Pt congratulated. Wt Readings from Last 3 Encounters:  06/16/12 171 lb 8 oz (77.792 kg)  03/16/12 169 lb 8 oz (76.885 kg)  09/14/11 172 lb 12.8 oz (78.382 kg)

## 2012-06-16 NOTE — Assessment & Plan Note (Signed)
Per pt itchy.. Cortisone cream has help itch. ? If changed lesion. Treat with topical triamcinolone.. If not resolving at next OV consider refferal for bx.

## 2012-06-16 NOTE — Progress Notes (Deleted)
  Subjective:    Patient ID: Dana Rogers, female    DOB: [birth year], 57 y.o.   MRN: [id number]  HPI    Review of Systems     Objective:   Physical Exam        Assessment & Plan:

## 2012-06-16 NOTE — Assessment & Plan Note (Signed)
LDL at goal 

## 2012-06-16 NOTE — Assessment & Plan Note (Signed)
Borderline control on multiple meds. Conitnue to follow. Eval heart as above.

## 2012-06-16 NOTE — Progress Notes (Signed)
Hypertension: Almost at goal <130/80 today on hyzaar, norvasc and altace.  Using medication without problems or lightheadedness:  Chest pain with exertion: None  occ chest tightness with stress x several months. Edema:None  Short of breath:mild x few months.  Average home BPs:not checking at home Other issues:  2009 stress test low risk. Fatigued x several months. No heartburn.  Family history of CAD father age 58.   Elevated Cholesterol: Well controlled on zocor 40 mg daily.  Lab Results  Component Value Date   CHOL 139 03/09/2012   HDL 27* 03/09/2012   LDLCALC 89 03/09/2012   TRIG 114 03/09/2012   CHOLHDL 5.1 03/09/2012   Using medications without problems: none  Muscle aches: None   Good diet  No exercise. Other complaints:   Diabetes: Well controlled at last check on current meds.On glipizide and metformin.  Great job! Lab Results  Component Value Date   HGBA1C 6.8* 03/09/2012   Using medications without difficulties: Yes  Hypoglycemic episodes:None  Hyperglycemic episodes:None  Feet problems:None  Blood Sugars averaging: Rarely.  eye exam within last year:  None  Request breast and pap today.  Review of Systems  Constitutional: Negative for fever and fatigue.  HENT: Negative for ear pain.  Eyes: Negative for pain.  Respiratory: Negative for chest tightness and shortness of breath.  Cardiovascular: Negative for chest pain, palpitations and leg swelling.  Gastrointestinal: Negative for abdominal pain.  Genitourinary: Negative for dysuria.  Objective:   Physical Exam  Constitutional: Vital signs are normal. She appears well-developed and well-nourished. She is cooperative. Non-toxic appearance. She does not appear ill. No distress.  HENT:  Head: Normocephalic.  Right Ear: Hearing, tympanic membrane, external ear and ear canal normal. Tympanic membrane is not erythematous, not retracted and not bulging.  Left Ear: Hearing, tympanic membrane, external ear and ear  canal normal. Tympanic membrane is not erythematous, not retracted and not bulging.  Nose: No mucosal edema or rhinorrhea. Right sinus exhibits no maxillary sinus tenderness and no frontal sinus tenderness. Left sinus exhibits no maxillary sinus tenderness and no frontal sinus tenderness.  Mouth/Throat: Uvula is midline, oropharynx is clear and moist and mucous membranes are normal.  Eyes: Conjunctivae, EOM and lids are normal. Pupils are equal, round, and reactive to light. No foreign bodies found.  Neck: Trachea normal and normal range of motion. Neck supple. Carotid bruit is not present. No mass and no thyromegaly present.  Cardiovascular: Normal rate, regular rhythm, S1 normal, S2 normal, normal heart sounds, intact distal pulses and normal pulses. Exam reveals no gallop and no friction rub.  No murmur heard.  Pulmonary/Chest: Effort normal and breath sounds normal. Not tachypneic. No respiratory distress. She has no decreased breath sounds. She has no wheezes. She has no rhonchi. She has no rales.  Abdominal: Soft. Normal appearance and bowel sounds are normal. There is no tenderness.  Neurological: She is alert.  GYN: nml cervix, uterus and ovaries on palpation and inspection PAP sent. Skin: Skin is warm, dry and intact. No rash noted.  Psychiatric: She has a normal mood and affect. Her speech is normal and behavior is normal. Judgment and thought content normal. Her mood appears not anxious. Cognition and memory are normal. She does not exhibit a depressed mood.  Diabetic foot exam:  Normal inspection  No skin breakdown  No calluses  Normal DP pulses  Normal sensation to light touch and monofilament  Nails normal  Assessment & Plan:   The patient's preventative maintenance and recommended  screening tests for an annual wellness exam were reviewed in full today.  Brought up to date unless services declined.  Counselled on the importance of diet, exercise, and its role in overall health  and mortality.  The patient's FH and SH was reviewed, including their home life, tobacco status, and drug and alcohol status.   Last PAP nml  2010, 2012.. Never had an abnormal.  If normla plan every q2 years. Yearly DVE. Last mammo 2012.. Due. Colon cancer screening:  Could not tolerate golytely in past so cancer colonoscopy 2010.  Vaccines up to date, PNA and Tdap UpTo Date, refused flu . DEXA: no early osteo in family.. Plan start 60 -65.

## 2012-06-16 NOTE — Patient Instructions (Addendum)
Schedule mammogram on your own. Stop at the lab on your way out to pick up stool test and for nonfasting labs.. Sto at front desk to set up heart test. Apply triamcinolone cream to vulvar lesion. Arrange yearly eye exam. Working on regular exercise 5-7 times week.  Follow up in 3 months for DM, HTN check and  vulvar lesion eval, fasting labs prior.

## 2012-06-16 NOTE — Assessment & Plan Note (Addendum)
   And fatigue, mild SOB. High risk with DM, HTN, high cholesterol as well as family history of early CAD. Will eval with stress test and ECHO

## 2012-06-22 NOTE — Addendum Note (Signed)
Addended by: Kerby Nora E on: 06/22/2012 03:32 PM   Modules accepted: Orders

## 2012-06-27 ENCOUNTER — Ambulatory Visit (HOSPITAL_COMMUNITY): Payer: PRIVATE HEALTH INSURANCE | Attending: Family Medicine

## 2012-06-27 ENCOUNTER — Ambulatory Visit (HOSPITAL_BASED_OUTPATIENT_CLINIC_OR_DEPARTMENT_OTHER): Payer: PRIVATE HEALTH INSURANCE

## 2012-06-27 ENCOUNTER — Encounter: Payer: Self-pay | Admitting: Family Medicine

## 2012-06-27 DIAGNOSIS — R072 Precordial pain: Secondary | ICD-10-CM

## 2012-06-27 DIAGNOSIS — R5381 Other malaise: Secondary | ICD-10-CM | POA: Insufficient documentation

## 2012-06-27 DIAGNOSIS — R0989 Other specified symptoms and signs involving the circulatory and respiratory systems: Secondary | ICD-10-CM

## 2012-06-27 DIAGNOSIS — R5383 Other fatigue: Secondary | ICD-10-CM

## 2012-06-27 DIAGNOSIS — R0789 Other chest pain: Secondary | ICD-10-CM | POA: Insufficient documentation

## 2012-06-27 NOTE — Progress Notes (Signed)
Echocardiogram performed.  

## 2012-06-28 ENCOUNTER — Encounter: Payer: Self-pay | Admitting: *Deleted

## 2012-07-20 ENCOUNTER — Ambulatory Visit (INDEPENDENT_AMBULATORY_CARE_PROVIDER_SITE_OTHER): Payer: PRIVATE HEALTH INSURANCE | Admitting: Family Medicine

## 2012-07-20 VITALS — BP 120/70 | HR 97 | Temp 98.5°F | Ht 67.0 in | Wt 170.5 lb

## 2012-07-20 DIAGNOSIS — B029 Zoster without complications: Secondary | ICD-10-CM | POA: Insufficient documentation

## 2012-07-20 MED ORDER — VALACYCLOVIR HCL 1 G PO TABS: 1000.0000 mg | ORAL_TABLET | Freq: Three times a day (TID) | ORAL | Status: DC

## 2012-07-20 NOTE — Patient Instructions (Signed)
Use tylenol for pain regularly. Call if pain is not well controlled. Start and complete antiviral.. Stay away from anyone immunocompromised and less than age 58 or anyone who has not had chickenpox.

## 2012-07-20 NOTE — Progress Notes (Signed)
  Subjective:    Patient ID: Dana Rogers, female    DOB: May 21, 1955, 57 y.o.   MRN: [id number]  Rash This is a new problem. The current episode started in the past 7 days (3 days ago). The affected locations include the chest (started on left upper chest then spread to back of neck and shoulder). The rash is characterized by blistering and redness (sensitive, not to painful, not itchy). She was exposed to nothing (no sick contacts). Pertinent negatives include no congestion, cough, eye pain, fatigue, fever, joint pain, shortness of breath or vomiting. Past treatments include topical steroids and antihistamine. The treatment provided mild relief. Her past medical history is significant for varicella. There is no history of allergies, asthma or eczema.      Review of Systems  Constitutional: Negative for fever and fatigue.  HENT: Negative for congestion.   Eyes: Negative for pain.  Respiratory: Negative for cough and shortness of breath.   Cardiovascular: Negative for chest pain, palpitations and leg swelling.  Gastrointestinal: Negative for vomiting and abdominal pain.  Genitourinary: Negative for dysuria.  Musculoskeletal: Negative for joint pain.  Skin: Positive for rash.       Objective:   Physical Exam  Constitutional: Vital signs are normal. She appears well-developed and well-nourished. She is cooperative.  Non-toxic appearance. She does not appear ill. No distress.  HENT:  Head: Normocephalic.  Right Ear: Hearing, tympanic membrane, external ear and ear canal normal. Tympanic membrane is not erythematous, not retracted and not bulging.  Left Ear: Hearing, tympanic membrane, external ear and ear canal normal. Tympanic membrane is not erythematous, not retracted and not bulging.  Nose: No mucosal edema or rhinorrhea. Right sinus exhibits no maxillary sinus tenderness and no frontal sinus tenderness. Left sinus exhibits no maxillary sinus tenderness and no frontal sinus tenderness.   Mouth/Throat: Uvula is midline, oropharynx is clear and moist and mucous membranes are normal.  Eyes: Conjunctivae, EOM and lids are normal. Pupils are equal, round, and reactive to light. No foreign bodies found.  Neck: Trachea normal and normal range of motion. Neck supple. Carotid bruit is not present. No mass and no thyromegaly present.  Cardiovascular: Normal rate, regular rhythm, S1 normal, S2 normal, normal heart sounds, intact distal pulses and normal pulses.  Exam reveals no gallop and no friction rub.   No murmur heard. Pulmonary/Chest: Effort normal and breath sounds normal. Not tachypneic. No respiratory distress. She has no decreased breath sounds. She has no wheezes. She has no rhonchi. She has no rales.  Abdominal: Soft. Normal appearance and bowel sounds are normal. There is no tenderness.  Neurological: She is alert.  Skin: Skin is warm, dry and intact. Rash noted. Rash is vesicular.  Erythematous vesicular rash in dermatomal distribution on left upper chest wall , shoulder and left neck. Does NOT cross midline.  Psychiatric: Her speech is normal and behavior is normal. Judgment and thought content normal. Her mood appears not anxious. Cognition and memory are normal. She does not exhibit a depressed mood.          Assessment & Plan:

## 2012-07-20 NOTE — Assessment & Plan Note (Signed)
Treat with pain control and antiviral.  Counseled on avoidance of at risk people until rash sccabbed up.

## 2012-08-31 ENCOUNTER — Other Ambulatory Visit: Payer: Self-pay | Admitting: Family Medicine

## 2012-09-11 ENCOUNTER — Telehealth: Payer: Self-pay | Admitting: Family Medicine

## 2012-09-11 DIAGNOSIS — E119 Type 2 diabetes mellitus without complications: Secondary | ICD-10-CM

## 2012-09-11 DIAGNOSIS — I10 Essential (primary) hypertension: Secondary | ICD-10-CM

## 2012-09-11 DIAGNOSIS — E785 Hyperlipidemia, unspecified: Secondary | ICD-10-CM

## 2012-09-11 NOTE — Telephone Encounter (Signed)
Message copied by Excell Seltzer on Mon Sep 11, 2012  8:20 AM ------      Message from: [place], [place] J      Created: Thu Aug 31, 2012  2:57 PM      Regarding: lab orders for Monday, 5.19.14       3 month f/u labs ------

## 2012-09-12 ENCOUNTER — Other Ambulatory Visit: Payer: PRIVATE HEALTH INSURANCE

## 2012-09-19 ENCOUNTER — Ambulatory Visit (INDEPENDENT_AMBULATORY_CARE_PROVIDER_SITE_OTHER): Payer: PRIVATE HEALTH INSURANCE | Admitting: Family Medicine

## 2012-09-19 ENCOUNTER — Encounter: Payer: Self-pay | Admitting: Family Medicine

## 2012-09-19 VITALS — BP 130/84 | HR 63 | Temp 97.8°F | Ht 67.0 in | Wt 169.5 lb

## 2012-09-19 DIAGNOSIS — E785 Hyperlipidemia, unspecified: Secondary | ICD-10-CM

## 2012-09-19 DIAGNOSIS — E119 Type 2 diabetes mellitus without complications: Secondary | ICD-10-CM

## 2012-09-19 DIAGNOSIS — I10 Essential (primary) hypertension: Secondary | ICD-10-CM

## 2012-09-19 MED ORDER — TRIAMCINOLONE ACETONIDE 0.1 % EX CREA: TOPICAL_CREAM | Freq: Two times a day (BID) | CUTANEOUS | Status: DC

## 2012-09-19 NOTE — Addendum Note (Signed)
Addended by: Consuello Masse on: 09/19/2012 09:10 AM   Modules accepted: Orders

## 2012-09-19 NOTE — Addendum Note (Signed)
Addended by: Alvina Chou on: 09/19/2012 09:37 AM   Modules accepted: Orders

## 2012-09-19 NOTE — Progress Notes (Signed)
57 year old femalee presents for 3 months.  At last OV in 05/2012.Marland Kitchen She was having DOE, chest tightness, fatigue.  Adenosine cardiolyte was low risk for ischemia, EF was in nml range. These symptoms have all resolved at this time.  Hypertension: Almost at goal <130/80 today on hyzaar, norvasc and altace.  Using medication without problems or lightheadedness: None Chest pain with exertion: None Edema:None  Short of breath:. None Average home BPs:N ot checking at home  Other issues: 2009 stress test low risk.  Family history of CAD father age 40.   She is requesting refill of triamcinolone cream to continue using on B arms for rash.   Elevated Cholesterol: Well controlled on zocor 40 mg daily at last check. Lab Results  Component Value Date   CHOL 139 03/09/2012   HDL 27* 03/09/2012   LDLCALC 89 03/09/2012   TRIG 114 03/09/2012   CHOLHDL 5.1 03/09/2012   Using medications without problems: none  Muscle aches: None  Good diet  No exercise.  Other complaints:   Diabetes: Well controlled at last check on current meds.On glipizide and metformin.  Due for labs today.  Lab Results  Component Value Date   HGBA1C 6.8* 03/09/2012  Using medications without difficulties: Yes  Hypoglycemic episodes:None  Hyperglycemic episodes:None  Feet problems:None  Blood Sugars averaging: Rarely.  eye exam within last year: None   Review of Systems  Constitutional: Negative for fever and fatigue.  HENT: Negative for ear pain.  Eyes: Negative for pain.  Respiratory: Negative for chest tightness and shortness of breath.  Cardiovascular: Negative for chest pain, palpitations and leg swelling.  Gastrointestinal: Negative for abdominal pain.  Genitourinary: Negative for dysuria.  Objective:   Physical Exam  Constitutional: Vital signs are normal. She appears well-developed and well-nourished. She is cooperative. Non-toxic appearance. She does not appear ill. No distress.  HENT:  Head:  Normocephalic.  Right Ear: Hearing, tympanic membrane, external ear and ear canal normal. Tympanic membrane is not erythematous, not retracted and not bulging.  Left Ear: Hearing, tympanic membrane, external ear and ear canal normal. Tympanic membrane is not erythematous, not retracted and not bulging.  Nose: No mucosal edema or rhinorrhea. Right sinus exhibits no maxillary sinus tenderness and no frontal sinus tenderness. Left sinus exhibits no maxillary sinus tenderness and no frontal sinus tenderness.  Mouth/Throat: Uvula is midline, oropharynx is clear and moist and mucous membranes are normal.  Eyes: Conjunctivae, EOM and lids are normal. Pupils are equal, round, and reactive to light. No foreign bodies found.  Neck: Trachea normal and normal range of motion. Neck supple. Carotid bruit is not present. No mass and no thyromegaly present.  Cardiovascular: Normal rate, regular rhythm, S1 normal, S2 normal, normal heart sounds, intact distal pulses and normal pulses. Exam reveals no gallop and no friction rub.  No murmur heard.  Pulmonary/Chest: Effort normal and breath sounds normal. Not tachypneic. No respiratory distress. She has no decreased breath sounds. She has no wheezes. She has no rhonchi. She has no rales.  Abdominal: Soft. Normal appearance and bowel sounds are normal. There is no tenderness.  Neurological: She is alert.  Skin: Skin is warm, dry and intact. No rash noted.  Psychiatric: She has a normal mood and affect. Her speech is normal and behavior is normal. Judgment and thought content normal. Her mood appears not anxious. Cognition and memory are normal. She does not exhibit a depressed mood.   Diabetic foot exam:  Normal inspection  No skin breakdown  No calluses  Normal DP pulses  Normal sensation to light touch and monofilament  Nails normal

## 2012-09-19 NOTE — Assessment & Plan Note (Signed)
Due for re-eval. 

## 2012-09-19 NOTE — Patient Instructions (Addendum)
Don't forget to set up yearly eye exam. Work on exercise, weight loss, healthy eating habits. Stop at lab on your way out.. We will call with results. Follow up in 6 months DM fasting labs prior.

## 2012-09-19 NOTE — Assessment & Plan Note (Addendum)
Due fr re-eval.

## 2012-09-19 NOTE — Assessment & Plan Note (Signed)
Well controlled. Continue current medication.  

## 2012-09-20 LAB — LIPID PANEL
Cholesterol: 150 mg/dL (ref 0–200)
Triglycerides: 168 mg/dL — ABNORMAL HIGH (ref <150)
VLDL: 34 mg/dL (ref 0–40)

## 2012-09-20 LAB — COMPREHENSIVE METABOLIC PANEL
Albumin: 3.9 g/dL (ref 3.5–5.2)
CO2: 28 mEq/L (ref 19–32)
Glucose, Bld: 114 mg/dL — ABNORMAL HIGH (ref 70–99)
Potassium: 3.7 mEq/L (ref 3.5–5.3)
Sodium: 135 mEq/L (ref 135–145)
Total Bilirubin: 0.9 mg/dL (ref 0.3–1.2)
Total Protein: 7.7 g/dL (ref 6.0–8.3)

## 2012-09-21 ENCOUNTER — Encounter: Payer: Self-pay | Admitting: *Deleted

## 2012-09-27 ENCOUNTER — Other Ambulatory Visit: Payer: Self-pay | Admitting: Family Medicine

## 2012-10-30 ENCOUNTER — Other Ambulatory Visit: Payer: Self-pay

## 2012-10-30 MED ORDER — CYCLOBENZAPRINE HCL 10 MG PO TABS: 10.0000 mg | ORAL_TABLET | Freq: Every evening | ORAL | Status: AC | PRN

## 2012-10-30 MED ORDER — DICLOFENAC SODIUM 75 MG PO TBEC: 75.0000 mg | DELAYED_RELEASE_TABLET | Freq: Two times a day (BID) | ORAL | Status: DC

## 2012-10-30 NOTE — Telephone Encounter (Signed)
Pt left v/m requesting refill on cyclobenzaprine and diclofenac to [facility]; pt having back problems again.Please advise.

## 2013-06-06 ENCOUNTER — Other Ambulatory Visit: Payer: Self-pay | Admitting: Family Medicine

## 2013-06-13 LAB — HM DIABETES EYE EXAM

## 2013-06-16 ENCOUNTER — Other Ambulatory Visit: Payer: Self-pay | Admitting: Family Medicine

## 2013-08-04 ENCOUNTER — Other Ambulatory Visit: Payer: Self-pay | Admitting: Family Medicine

## 2013-08-11 ENCOUNTER — Other Ambulatory Visit: Payer: Self-pay | Admitting: Family Medicine

## 2013-08-13 ENCOUNTER — Other Ambulatory Visit: Payer: Self-pay | Admitting: Family Medicine

## 2013-08-27 ENCOUNTER — Telehealth: Payer: Self-pay | Admitting: Family Medicine

## 2013-08-27 ENCOUNTER — Other Ambulatory Visit: Payer: PRIVATE HEALTH INSURANCE

## 2013-08-27 ENCOUNTER — Other Ambulatory Visit (INDEPENDENT_AMBULATORY_CARE_PROVIDER_SITE_OTHER): Payer: PRIVATE HEALTH INSURANCE

## 2013-08-27 DIAGNOSIS — I10 Essential (primary) hypertension: Secondary | ICD-10-CM

## 2013-08-27 DIAGNOSIS — E119 Type 2 diabetes mellitus without complications: Secondary | ICD-10-CM

## 2013-08-27 DIAGNOSIS — E785 Hyperlipidemia, unspecified: Secondary | ICD-10-CM

## 2013-08-27 NOTE — Telephone Encounter (Signed)
Message copied by Excell SeltzerBEDSOLE, AMY E on Mon Aug 27, 2013  7:33 AM ------      Message from: Alvina ChouWALSH, Dana Rogers J      Created: Thu Aug 16, 2013  9:54 AM      Regarding: Lab orders for Monday,5.4.15       Patient is scheduled for CPX labs, please order future labs, Thanks , Dana Rogers       ------

## 2013-08-28 LAB — LIPID PANEL
Cholesterol: 162 mg/dL (ref 0–200)
HDL: 32 mg/dL — ABNORMAL LOW (ref >39)
LDL CALC: 107 mg/dL — AB (ref 0–99)
Total CHOL/HDL Ratio: 5.1 Ratio
Triglycerides: 114 mg/dL (ref <150)
VLDL: 23 mg/dL (ref 0–40)

## 2013-08-28 LAB — COMPREHENSIVE METABOLIC PANEL
ALK PHOS: 59 U/L (ref 39–117)
ALT: 13 U/L (ref 0–35)
AST: 18 U/L (ref 0–37)
Albumin: 4.4 g/dL (ref 3.5–5.2)
BUN: 22 mg/dL (ref 6–23)
CO2: 27 mEq/L (ref 19–32)
Calcium: 9.9 mg/dL (ref 8.4–10.5)
Chloride: 99 mEq/L (ref 96–112)
Creat: 1.15 mg/dL — ABNORMAL HIGH (ref 0.50–1.10)
GLUCOSE: 127 mg/dL — AB (ref 70–99)
Potassium: 3.8 mEq/L (ref 3.5–5.3)
Sodium: 137 mEq/L (ref 135–145)
TOTAL PROTEIN: 8.2 g/dL (ref 6.0–8.3)
Total Bilirubin: 1.2 mg/dL (ref 0.2–1.2)

## 2013-08-28 LAB — HEMOGLOBIN A1C
Hgb A1c MFr Bld: 7.3 % — ABNORMAL HIGH (ref <5.7)
MEAN PLASMA GLUCOSE: 163 mg/dL — AB (ref <117)

## 2013-09-03 ENCOUNTER — Telehealth: Payer: Self-pay | Admitting: Family Medicine

## 2013-09-03 ENCOUNTER — Ambulatory Visit (INDEPENDENT_AMBULATORY_CARE_PROVIDER_SITE_OTHER): Payer: PRIVATE HEALTH INSURANCE | Admitting: Family Medicine

## 2013-09-03 ENCOUNTER — Encounter: Payer: Self-pay | Admitting: Family Medicine

## 2013-09-03 ENCOUNTER — Other Ambulatory Visit (HOSPITAL_COMMUNITY)
Admission: RE | Admit: 2013-09-03 | Discharge: 2013-09-03 | Disposition: A | Discharge status: Home / Self Care | Payer: PRIVATE HEALTH INSURANCE | Source: Ambulatory Visit | Attending: Family Medicine | Admitting: Family Medicine

## 2013-09-03 VITALS — BP 124/90 | HR 69 | Temp 98.2°F | Ht 65.51 in | Wt 167.2 lb

## 2013-09-03 DIAGNOSIS — R87810 Cervical high risk human papillomavirus (HPV) DNA test positive: Secondary | ICD-10-CM | POA: Insufficient documentation

## 2013-09-03 DIAGNOSIS — G4700 Insomnia, unspecified: Secondary | ICD-10-CM

## 2013-09-03 DIAGNOSIS — Z124 Encounter for screening for malignant neoplasm of cervix: Secondary | ICD-10-CM | POA: Insufficient documentation

## 2013-09-03 DIAGNOSIS — F5104 Psychophysiologic insomnia: Secondary | ICD-10-CM

## 2013-09-03 DIAGNOSIS — Z0000 Encounter for general adult medical examination without abnormal findings: Secondary | ICD-10-CM

## 2013-09-03 DIAGNOSIS — Z1212 Encounter for screening for malignant neoplasm of rectum: Secondary | ICD-10-CM

## 2013-09-03 DIAGNOSIS — E119 Type 2 diabetes mellitus without complications: Secondary | ICD-10-CM

## 2013-09-03 DIAGNOSIS — I10 Essential (primary) hypertension: Secondary | ICD-10-CM

## 2013-09-03 DIAGNOSIS — E785 Hyperlipidemia, unspecified: Secondary | ICD-10-CM

## 2013-09-03 DIAGNOSIS — Z1151 Encounter for screening for human papillomavirus (HPV): Secondary | ICD-10-CM | POA: Insufficient documentation

## 2013-09-03 MED ORDER — TRIAMCINOLONE ACETONIDE 0.1 % EX CREA: TOPICAL_CREAM | Freq: Two times a day (BID) | CUTANEOUS | Status: DC

## 2013-09-03 MED ORDER — DICLOFENAC SODIUM 75 MG PO TBEC: 75.0000 mg | DELAYED_RELEASE_TABLET | Freq: Two times a day (BID) | ORAL | Status: AC | PRN

## 2013-09-03 MED ORDER — TRAZODONE HCL 50 MG PO TABS: 25.0000 mg | ORAL_TABLET | Freq: Every evening | ORAL | Status: DC | PRN

## 2013-09-03 NOTE — Progress Notes (Signed)
The patient is here for annual wellness exam and preventative care.    Hypertension: Almost at goal <130/80 today on hyzaar, norvasc and altace.  BP Readings from Last 3 Encounters:  09/03/13 124/90  09/19/12 130/84  07/20/12 120/70  Using medication without problems or lightheadedness:  None Chest pain with exertion: None  Edema:None  Short of breath:mild x few months.  Average home BPs:not checking at home  Other issues: 2014 stress test low risk.   Elevated Cholesterol: Almost at goal < 100, she has not been taking zocor 40 mg daily because forgot.  Lab Results  Component Value Date   CHOL 162 08/27/2013   HDL 32* 08/27/2013   LDLCALC 107* 08/27/2013   TRIG 114 08/27/2013   CHOLHDL 5.1 08/27/2013  Using medications without problems: none  Muscle aches: None  Good diet  No exercise.  Other complaints:   Diabetes: Worsened control on current meds.On glipizide and metformin.  Lab Results  Component Value Date   HGBA1C 7.3* 08/27/2013  Using medications without difficulties: Yes  Hypoglycemic episodes:None  Hyperglycemic episodes:None  Feet problems:None  Blood Sugars averaging: Rarely.  eye exam within last year: None    She has been under a lot of stress lately. She has trouble falling asleep and staying asleep. She has noted mild anxiety, she has had increase work at work, more responsisibities. She has tried unisom, melatonin... No relief.  Review of Systems  Constitutional: Negative for fever and fatigue.  HENT: Negative for ear pain.  Eyes: Negative for pain.  Respiratory: Negative for chest tightness and shortness of breath.  Cardiovascular: Negative for chest pain, palpitations and leg swelling.  Gastrointestinal: Negative for abdominal pain.  Genitourinary: Negative for dysuria.  Objective:   Physical Exam  Constitutional: Vital signs are normal. She appears well-developed and well-nourished. She is cooperative. Non-toxic appearance. She does not appear ill. No  distress.  HENT:  Head: Normocephalic.  Right Ear: Hearing, tympanic membrane, external ear and ear canal normal. Tympanic membrane is not erythematous, not retracted and not bulging.  Left Ear: Hearing, tympanic membrane, external ear and ear canal normal. Tympanic membrane is not erythematous, not retracted and not bulging.  Nose: No mucosal edema or rhinorrhea. Right sinus exhibits no maxillary sinus tenderness and no frontal sinus tenderness. Left sinus exhibits no maxillary sinus tenderness and no frontal sinus tenderness.  Mouth/Throat: Uvula is midline, oropharynx is clear and moist and mucous membranes are normal.  Eyes: Conjunctivae, EOM and lids are normal. Pupils are equal, round, and reactive to light. No foreign bodies found.  Neck: Trachea normal and normal range of motion. Neck supple. Carotid bruit is not present. No mass and no thyromegaly present.  Cardiovascular: Normal rate, regular rhythm, S1 normal, S2 normal, normal heart sounds, intact distal pulses and normal pulses. Exam reveals no gallop and no friction rub.  No murmur heard.  Pulmonary/Chest: Effort normal and breath sounds normal. Not tachypneic. No respiratory distress. She has no decreased breath sounds. She has no wheezes. She has no rhonchi. She has no rales.  Abdominal: Soft. Normal appearance and bowel sounds are normal. There is no tenderness.  Neurological: She is alert.  GYN: uterus and ovaries on palpation and inspection, cervix clear, pap performed. Skin: Skin is warm, dry and intact. No rash noted.  Psychiatric: She has a normal mood and affect. Her speech is normal and behavior is normal. Judgment and thought content normal. Her mood appears not anxious. Cognition and memory are normal. She does not  exhibit a depressed mood.   Diabetic foot exam:  Normal inspection  No skin breakdown  No calluses  Normal DP pulses  Normal sensation to light touch and monofilament  Nails normal  Assessment & Plan:    The patient's preventative maintenance and recommended screening tests for an annual wellness exam were reviewed in full today.  Brought up to date unless services declined.  Counselled on the importance of diet, exercise, and its role in overall health and mortality.  The patient's FH and SH was reviewed, including their home life, tobacco status, and drug and alcohol status.   Last PAP nml 2010, 2012..high risk HPV 05/2012, needs repeat pap  Last mammo 2012.. Due.  Colon cancer screening: Could not tolerate golytely in past, unable to do colonoscopy 2010.  Plan yearly ifob. Vaccines up to date with PNA, Tdap,  refused flu .  DEXA: no early osteo in family.. Plan start 60 -65.

## 2013-09-03 NOTE — Telephone Encounter (Signed)
Relevant patient education assigned to patient using Emmi. ° °

## 2013-09-03 NOTE — Assessment & Plan Note (Signed)
She wishes to stay of zocor and try to get chol down with diet. NO SE just would like to limit meds. Recheck in 6 months.

## 2013-09-03 NOTE — Assessment & Plan Note (Signed)
Well controlled. Continue current medication.  

## 2013-09-03 NOTE — Assessment & Plan Note (Signed)
Info provided and pt counseled on sleep hygiene. Will try trial of trazodone for sleep.

## 2013-09-03 NOTE — Assessment & Plan Note (Addendum)
Get back on track with lifestyle. Will return to recheck in 3 months.

## 2013-09-03 NOTE — Progress Notes (Signed)
Pre visit review using our clinic review tool, if applicable. No additional management support is needed unless otherwise documented below in the visit note. 

## 2013-09-03 NOTE — Patient Instructions (Addendum)
Return for DM and chol check  In 3 months With fasting labs prior. Work on exercise, low cholesterol , low carb diet.  Trial of trazodone for sleep. No TV in bed.    Insomnia Insomnia is frequent trouble falling and/or staying asleep. Insomnia can be a long term problem or a short term problem. Both are common. Insomnia can be a short term problem when the wakefulness is related to a certain stress or worry. Long term insomnia is often related to ongoing stress during waking hours and/or poor sleeping habits. Overtime, sleep deprivation itself can make the problem worse. Every little thing feels more severe because you are overtired and your ability to cope is decreased. CAUSES   Stress, anxiety, and depression.  Poor sleeping habits.  Distractions such as TV in the bedroom.  Naps close to bedtime.  Engaging in emotionally charged conversations before bed.  Technical reading before sleep.  Alcohol and other sedatives. They may make the problem worse. They can hurt normal sleep patterns and normal dream activity.  Stimulants such as caffeine for several hours prior to bedtime.  Pain syndromes and shortness of breath can cause insomnia.  Exercise late at night.  Changing time zones may cause sleeping problems (jet lag). It is sometimes helpful to have someone observe your sleeping patterns. They should look for periods of not breathing during the night (sleep apnea). They should also look to see how long those periods last. If you live alone or observers are uncertain, you can also be observed at a sleep clinic where your sleep patterns will be professionally monitored. Sleep apnea requires a checkup and treatment. Give your caregivers your medical history. Give your caregivers observations your family has made about your sleep.  SYMPTOMS   Not feeling rested in the morning.  Anxiety and restlessness at bedtime.  Difficulty falling and staying asleep. TREATMENT   Your  caregiver may prescribe treatment for an underlying medical disorders. Your caregiver can give advice or help if you are using alcohol or other drugs for self-medication. Treatment of underlying problems will usually eliminate insomnia problems.  Medications can be prescribed for short time use. They are generally not recommended for lengthy use.  Over-the-counter sleep medicines are not recommended for lengthy use. They can be habit forming.  You can promote easier sleeping by making lifestyle changes such as:  Using relaxation techniques that help with breathing and reduce muscle tension.  Exercising earlier in the day.  Changing your diet and the time of your last meal. No night time snacks.  Establish a regular time to go to bed.  Counseling can help with stressful problems and worry.  Soothing music and white noise may be helpful if there are background noises you cannot remove.  Stop tedious detailed work at least one hour before bedtime. HOME CARE INSTRUCTIONS   Keep a diary. Inform your caregiver about your progress. This includes any medication side effects. See your caregiver regularly. Take note of:  Times when you are asleep.  Times when you are awake during the night.  The quality of your sleep.  How you feel the next day. This information will help your caregiver care for you.  Get out of bed if you are still awake after 15 minutes. Read or do some quiet activity. Keep the lights down. Wait until you feel sleepy and go back to bed.  Keep regular sleeping and waking hours. Avoid naps.  Exercise regularly.  Avoid distractions at bedtime. Distractions include watching  television or engaging in any intense or detailed activity like attempting to balance the household checkbook.  Develop a bedtime ritual. Keep a familiar routine of bathing, brushing your teeth, climbing into bed at the same time each night, listening to soothing music. Routines increase the success  of falling to sleep faster.  Use relaxation techniques. This can be using breathing and muscle tension release routines. It can also include visualizing peaceful scenes. You can also help control troubling or intruding thoughts by keeping your mind occupied with boring or repetitive thoughts like the old concept of counting sheep. You can make it more creative like imagining planting one beautiful flower after another in your backyard garden.  During your day, work to eliminate stress. When this is not possible use some of the previous suggestions to help reduce the anxiety that accompanies stressful situations. MAKE SURE YOU:   Understand these instructions.  Will watch your condition.  Will get help right away if you are not doing well or get worse. Document Released: 04/09/2000 Document Revised: 07/05/2011 Document Reviewed: 05/10/2007 [facility] Patient Information 2014 [place], [place].

## 2013-09-07 ENCOUNTER — Other Ambulatory Visit (HOSPITAL_COMMUNITY)
Admission: RE | Admit: 2013-09-07 | Discharge: 2013-09-07 | Disposition: A | Discharge status: Home / Self Care | Payer: PRIVATE HEALTH INSURANCE | Source: Ambulatory Visit | Attending: Family Medicine | Admitting: Family Medicine

## 2013-09-07 ENCOUNTER — Encounter: Payer: Self-pay | Admitting: *Deleted

## 2013-09-10 ENCOUNTER — Other Ambulatory Visit: Payer: Self-pay | Admitting: Family Medicine

## 2013-09-17 ENCOUNTER — Other Ambulatory Visit: Payer: Self-pay | Admitting: Family Medicine

## 2013-09-18 ENCOUNTER — Other Ambulatory Visit: Payer: Self-pay | Admitting: Family Medicine

## 2013-10-04 ENCOUNTER — Telehealth: Payer: Self-pay

## 2013-10-04 NOTE — Telephone Encounter (Signed)
Relevant patient education assigned to patient using Emmi. ° °

## 2013-10-10 ENCOUNTER — Other Ambulatory Visit: Payer: Self-pay | Admitting: Family Medicine

## 2013-11-02 ENCOUNTER — Telehealth: Payer: Self-pay | Admitting: *Deleted

## 2013-11-02 NOTE — Telephone Encounter (Signed)
**  DIABETIC BUNDLE**  Pt already has a lab appt schedule for 12/04/13 but she needs to f/u with Dr. Ermalene SearingBedsole after lab appt (to recheck BP)  Left voicemail requesting pt to call back and schedule a f/u appt with Dr. Ermalene SearingBedsole

## 2013-11-10 ENCOUNTER — Other Ambulatory Visit: Payer: Self-pay | Admitting: Family Medicine

## 2013-11-10 NOTE — Telephone Encounter (Signed)
Last office visit 09/03/2013.   Last refilled 09/03/2013 for #30 with no refills.  Ok to refill?

## 2013-12-04 ENCOUNTER — Other Ambulatory Visit (INDEPENDENT_AMBULATORY_CARE_PROVIDER_SITE_OTHER): Payer: PRIVATE HEALTH INSURANCE

## 2013-12-04 DIAGNOSIS — E119 Type 2 diabetes mellitus without complications: Secondary | ICD-10-CM

## 2013-12-04 DIAGNOSIS — E785 Hyperlipidemia, unspecified: Secondary | ICD-10-CM

## 2013-12-04 DIAGNOSIS — I10 Essential (primary) hypertension: Secondary | ICD-10-CM

## 2013-12-05 LAB — LIPID PANEL
CHOL/HDL RATIO: 5.1 ratio
Cholesterol: 153 mg/dL (ref 0–200)
HDL: 30 mg/dL — AB (ref >39)
LDL Cholesterol: 100 mg/dL — ABNORMAL HIGH (ref 0–99)
TRIGLYCERIDES: 114 mg/dL (ref <150)
VLDL: 23 mg/dL (ref 0–40)

## 2013-12-05 LAB — HEMOGLOBIN A1C
HEMOGLOBIN A1C: 7.8 % — AB (ref <5.7)
Mean Plasma Glucose: 177 mg/dL — ABNORMAL HIGH (ref <117)

## 2013-12-11 ENCOUNTER — Ambulatory Visit (INDEPENDENT_AMBULATORY_CARE_PROVIDER_SITE_OTHER): Payer: PRIVATE HEALTH INSURANCE | Admitting: Family Medicine

## 2013-12-11 ENCOUNTER — Encounter: Payer: Self-pay | Admitting: Family Medicine

## 2013-12-11 VITALS — BP 142/90 | HR 60 | Temp 98.2°F | Ht 65.5 in | Wt 170.5 lb

## 2013-12-11 DIAGNOSIS — E119 Type 2 diabetes mellitus without complications: Secondary | ICD-10-CM

## 2013-12-11 DIAGNOSIS — G4700 Insomnia, unspecified: Secondary | ICD-10-CM

## 2013-12-11 DIAGNOSIS — F5104 Psychophysiologic insomnia: Secondary | ICD-10-CM

## 2013-12-11 DIAGNOSIS — I10 Essential (primary) hypertension: Secondary | ICD-10-CM

## 2013-12-11 DIAGNOSIS — E785 Hyperlipidemia, unspecified: Secondary | ICD-10-CM

## 2013-12-11 LAB — HM DIABETES FOOT EXAM

## 2013-12-11 NOTE — Patient Instructions (Addendum)
Start checking blood sugar fasting (goal < 120) and occ 2 hours after meals (goal < 180). Stop at front desk to set up referral to nutritonist. Start exercise 3-5 times a week.  Diabetes and Exercise Exercising regularly is important. It is not just about losing weight. It has many health benefits, such as:  Improving your overall fitness, flexibility, and endurance.  Increasing your bone density.  Helping with weight control.  Decreasing your body fat.  Increasing your muscle strength.  Reducing stress and tension.  Improving your overall health. People with diabetes who exercise gain additional benefits because exercise:  Reduces appetite.  Improves the body's use of blood sugar (glucose).  Helps lower or control blood glucose.  Decreases blood pressure.  Helps control blood lipids (such as cholesterol and triglycerides).  Improves the body's use of the hormone insulin by:  Increasing the body's insulin sensitivity.  Reducing the body's insulin needs.  Decreases the risk for heart disease because exercising:  Lowers cholesterol and triglycerides levels.  Increases the levels of good cholesterol (such as high-density lipoproteins [HDL]) in the body.  Lowers blood glucose levels. YOUR ACTIVITY PLAN  Choose an activity that you enjoy and set realistic goals. Your health care provider or diabetes educator can help you make an activity plan that works for you. Exercise regularly as directed by your health care provider. This includes:  Performing resistance training twice a week such as push-ups, sit-ups, lifting weights, or using resistance bands.  Performing 150 minutes of cardio exercises each week such as walking, running, or playing sports.  Staying active and spending no more than 90 minutes at one time being inactive. Even short bursts of exercise are good for you. Three 10-minute sessions spread throughout the day are just as beneficial as a single 30-minute  session. Some exercise ideas include:  Taking the dog for a walk.  Taking the stairs instead of the elevator.  Dancing to your favorite song.  Doing an exercise video.  Doing your favorite exercise with a friend. RECOMMENDATIONS FOR EXERCISING WITH TYPE 1 OR TYPE 2 DIABETES   Check your blood glucose before exercising. If blood glucose levels are greater than 240 mg/dL, check for urine ketones. Do not exercise if ketones are present.  Avoid injecting insulin into areas of the body that are going to be exercised. For example, avoid injecting insulin into:  The arms when playing tennis.  The legs when jogging.  Keep a record of:  Food intake before and after you exercise.  Expected peak times of insulin action.  Blood glucose levels before and after you exercise.  The type and amount of exercise you have done.  Review your records with your health care provider. Your health care provider will help you to develop guidelines for adjusting food intake and insulin amounts before and after exercising.  If you take insulin or oral hypoglycemic agents, watch for signs and symptoms of hypoglycemia. They include:  Dizziness.  Shaking.  Sweating.  Chills.  Confusion.  Drink plenty of water while you exercise to prevent dehydration or heat stroke. Body water is lost during exercise and must be replaced.  Talk to your health care provider before starting an exercise program to make sure it is safe for you. Remember, almost any type of activity is better than none. Document Released: 07/03/2003 Document Revised: 08/27/2013 Document Reviewed: 09/19/2012 [facility] Patient Information 2015 [place], [place]. This information is not intended to replace advice given to you by your health care provider.  Make sure you discuss any questions you have with your health care provider.  

## 2013-12-11 NOTE — Assessment & Plan Note (Signed)
Well controlled. Continue current medication.  

## 2013-12-11 NOTE — Progress Notes (Signed)
Pre visit review using our clinic review tool, if applicable. No additional management support is needed unless otherwise documented below in the visit note. 

## 2013-12-11 NOTE — Assessment & Plan Note (Signed)
Poor control on glipizide and metformin. ( SE to higher doses of metformin) Discussed med options to treat. Pt is hesitant about adding a medication with possible SE. We will refer her to a nutritionist and recheck in 3 months.

## 2013-12-11 NOTE — Assessment & Plan Note (Signed)
Improved control and now at goal.

## 2013-12-11 NOTE — Progress Notes (Signed)
The patient is here for  DM follow up.  Hypertension: BP elevated today on hyzaar, norvasc and altace.  New JNC8 recs change her goal to 140/90. BP Readings from Last 3 Encounters:  12/11/13 142/90  09/03/13 124/90  09/19/12 130/84  Using medication without problems or lightheadedness: None  Chest pain with exertion: None  Edema:None  Short of breath:mild x few months.  Average home BPs:not checking at home  Other issues: 2014 stress test low risk.   Elevated Cholesterol: Now at goal < 100! on zocor 40 mg daily, now taking more regularly. Lab Results  Component Value Date   CHOL 153 12/04/2013   HDL 30* 12/04/2013   LDLCALC 100* 12/04/2013   TRIG 114 12/04/2013   CHOLHDL 5.1 12/04/2013  Using medications without problems: none  Muscle aches: None  Good diet  No exercise.  Other complaints:   Diabetes: Worsened control on current meds.On glipizide max and low dose metformin.  Did not tolerate high dose of metformin. Lab Results  Component Value Date   HGBA1C 7.8* 12/04/2013  Using medications without difficulties: Yes  Hypoglycemic episodes:None  Hyperglycemic episodes:None  Feet problems:None  Blood Sugars averaging: Rarely.  eye exam within last year: yes  Chronic insomnia, uses trazodone every few weeks. It does help her sleep.  She has less stress at this point.  Review of Systems  Constitutional: Negative for fever and fatigue.  HENT: Negative for ear pain.  Eyes: Negative for pain.  Respiratory: Negative for chest tightness and shortness of breath.  Cardiovascular: Negative for chest pain, palpitations and leg swelling.  Gastrointestinal: Negative for abdominal pain.  Genitourinary: Negative for dysuria.  Objective:   Physical Exam  Constitutional: Vital signs are normal. She appears well-developed and well-nourished. She is cooperative. Non-toxic appearance. She does not appear ill. No distress.  HENT:  Head: Normocephalic.  Right Ear: Hearing, tympanic  membrane, external ear and ear canal normal. Tympanic membrane is not erythematous, not retracted and not bulging.  Left Ear: Hearing, tympanic membrane, external ear and ear canal normal. Tympanic membrane is not erythematous, not retracted and not bulging.  Nose: No mucosal edema or rhinorrhea. Right sinus exhibits no maxillary sinus tenderness and no frontal sinus tenderness. Left sinus exhibits no maxillary sinus tenderness and no frontal sinus tenderness.  Mouth/Throat: Uvula is midline, oropharynx is clear and moist and mucous membranes are normal.  Eyes: Conjunctivae, EOM and lids are normal. Pupils are equal, round, and reactive to light. No foreign bodies found.  Neck: Trachea normal and normal range of motion. Neck supple. Carotid bruit is not present. No mass and no thyromegaly present.  Cardiovascular: Normal rate, regular rhythm, S1 normal, S2 normal, normal heart sounds, intact distal pulses and normal pulses. Exam reveals no gallop and no friction rub.  No murmur heard.  Pulmonary/Chest: Effort normal and breath sounds normal. Not tachypneic. No respiratory distress. She has no decreased breath sounds. She has no wheezes. She has no rhonchi. She has no rales.  Abdominal: Soft. Normal appearance and bowel sounds are normal. There is no tenderness.  Neurological: She is alert.  Skin: Skin is warm, dry and intact. No rash noted.  Psychiatric: She has a normal mood and affect. Her speech is normal and behavior is normal. Judgment and thought content normal. Her mood appears not anxious. Cognition and memory are normal. She does not exhibit a depressed mood.   Diabetic foot exam:  Normal inspection  No skin breakdown  No calluses  Normal DP pulses  Normal sensation to light touch and monofilament  Nails normal

## 2013-12-11 NOTE — Assessment & Plan Note (Signed)
Improved control on trazodone prn.

## 2013-12-22 ENCOUNTER — Other Ambulatory Visit: Payer: Self-pay | Admitting: Family Medicine

## 2014-01-14 ENCOUNTER — Ambulatory Visit: Payer: Self-pay | Admitting: Family Medicine

## 2014-01-24 ENCOUNTER — Ambulatory Visit: Payer: Self-pay | Admitting: Family Medicine

## 2014-01-25 ENCOUNTER — Telehealth: Payer: Self-pay

## 2014-01-25 MED ORDER — GLUCOSE BLOOD VI STRP: ORAL_STRIP | Status: DC

## 2014-01-25 MED ORDER — ONETOUCH DELICA LANCETS FINE MISC: Status: DC

## 2014-01-25 NOTE — Telephone Encounter (Signed)
Pt request rx for one touch ultra test strips and [facility]ets [facility] Pt has been to [facility] and given new meter. Advised pt done.

## 2014-02-06 ENCOUNTER — Other Ambulatory Visit: Payer: Self-pay | Admitting: Family Medicine

## 2014-02-20 ENCOUNTER — Other Ambulatory Visit: Payer: Self-pay | Admitting: Family Medicine

## 2014-02-20 NOTE — Telephone Encounter (Signed)
Last office visit 12/11/2013.  Last refilled 11/12/2013 for #30 with 2 refills.  Ok to refill?

## 2014-02-24 ENCOUNTER — Ambulatory Visit: Payer: Self-pay | Admitting: Family Medicine

## 2014-03-07 ENCOUNTER — Other Ambulatory Visit: Payer: PRIVATE HEALTH INSURANCE

## 2014-03-14 ENCOUNTER — Other Ambulatory Visit: Payer: PRIVATE HEALTH INSURANCE

## 2014-03-15 ENCOUNTER — Ambulatory Visit: Payer: PRIVATE HEALTH INSURANCE | Admitting: Family Medicine

## 2014-03-18 ENCOUNTER — Other Ambulatory Visit: Payer: PRIVATE HEALTH INSURANCE

## 2014-03-24 ENCOUNTER — Other Ambulatory Visit: Payer: Self-pay | Admitting: Family Medicine

## 2014-03-26 ENCOUNTER — Encounter: Payer: Self-pay | Admitting: Family Medicine

## 2014-03-26 ENCOUNTER — Ambulatory Visit (INDEPENDENT_AMBULATORY_CARE_PROVIDER_SITE_OTHER): Payer: PRIVATE HEALTH INSURANCE | Admitting: Family Medicine

## 2014-03-26 VITALS — BP 116/80 | HR 55 | Temp 98.1°F | Ht 65.5 in | Wt 171.0 lb

## 2014-03-26 DIAGNOSIS — I10 Essential (primary) hypertension: Secondary | ICD-10-CM

## 2014-03-26 DIAGNOSIS — E119 Type 2 diabetes mellitus without complications: Secondary | ICD-10-CM

## 2014-03-26 DIAGNOSIS — E785 Hyperlipidemia, unspecified: Secondary | ICD-10-CM

## 2014-03-26 LAB — HEMOGLOBIN A1C: HEMOGLOBIN A1C: 7.5 % — AB (ref 4.6–6.5)

## 2014-03-26 LAB — HM DIABETES FOOT EXAM

## 2014-03-26 NOTE — Patient Instructions (Addendum)
Can use 1-2 to two tabs of trazodone as needed for sleep.  Stop at lab on way out, we will call with the results.

## 2014-03-26 NOTE — Assessment & Plan Note (Signed)
Well controlled at last check  On zocor.

## 2014-03-26 NOTE — Addendum Note (Signed)
Addended by: Liane ComberHAVERS, Olar Santini C on: 03/26/2014 10:30 AM   Modules accepted: Orders

## 2014-03-26 NOTE — Progress Notes (Signed)
The patient is here for DM follow up.  Hypertension: BP good control today on hyzaar, norvasc and altace. New JNC8 recs change her goal to 140/90. BP Readings from Last 3 Encounters:  03/26/14 116/80  12/11/13 142/90  09/03/13 124/90  Using medication without problems or lightheadedness: None  Chest pain with exertion: None  Edema:None  Short of breath:mild x few months.  Average home BPs:not checking at home  Other issues: 2014 stress test low risk.   Elevated Cholesterol: Now at goal < 100! on zocor 40 mg daily, now taking more regularly. Lab Results  Component Value Date   CHOL 153 12/04/2013   HDL 30* 12/04/2013   LDLCALC 100* 12/04/2013   TRIG 114 12/04/2013   CHOLHDL 5.1 12/04/2013   Using medications without problems: none  Muscle aches: None  Good diet  No exercise.  Other complaints:   Diabetes: Due for re-eval. At last OV referral to nutritionist.On glipizide max and low dose metformin. Did not tolerate high dose of metformin. Lab Results  Component Value Date   HGBA1C 7.8* 12/04/2013  Using medications without difficulties: Yes  Hypoglycemic episodes:None  Hyperglycemic episodes:None  Feet problems:None  Blood Sugars averaging: Rarely.  eye exam within last year: due  She has made some changes in diet, but limited given the holiday's.  Chronic insomnia, improved control. Not needing trazodone much, doesn't help much , has not tried two tabs daily.  Review of Systems  Constitutional: Negative for fever and fatigue.  HENT: Negative for ear pain.  Eyes: Negative for pain.  Respiratory: Negative for chest tightness and shortness of breath.  Cardiovascular: Negative for chest pain, palpitations and leg swelling.  Gastrointestinal: Negative for abdominal pain.  Genitourinary: Negative for dysuria.  Objective:   Physical Exam  Constitutional: Vital signs are normal. She appears well-developed and well-nourished. She is cooperative.  Non-toxic appearance. She does not appear ill. No distress.  HENT:  Head: Normocephalic.  Right Ear: Hearing, tympanic membrane, external ear and ear canal normal. Tympanic membrane is not erythematous, not retracted and not bulging.  Left Ear: Hearing, tympanic membrane, external ear and ear canal normal. Tympanic membrane is not erythematous, not retracted and not bulging.  Nose: No mucosal edema or rhinorrhea. Right sinus exhibits no maxillary sinus tenderness and no frontal sinus tenderness. Left sinus exhibits no maxillary sinus tenderness and no frontal sinus tenderness.  Mouth/Throat: Uvula is midline, oropharynx is clear and moist and mucous membranes are normal.  Eyes: Conjunctivae, EOM and lids are normal. Pupils are equal, round, and reactive to light. No foreign bodies found.  Neck: Trachea normal and normal range of motion. Neck supple. Carotid bruit is not present. No mass and no thyromegaly present.  Cardiovascular: Normal rate, regular rhythm, S1 normal, S2 normal, normal heart sounds, intact distal pulses and normal pulses. Exam reveals no gallop and no friction rub.  No murmur heard.  Pulmonary/Chest: Effort normal and breath sounds normal. Not tachypneic. No respiratory distress. She has no decreased breath sounds. She has no wheezes. She has no rhonchi. She has no rales.  Abdominal: Soft. Normal appearance and bowel sounds are normal. There is no tenderness.  Neurological: She is alert.  Skin: Skin is warm, dry and intact. No rash noted.  Psychiatric: She has a normal mood and affect. Her speech is normal and behavior is normal. Judgment and thought content normal. Her mood appears not anxious. Cognition and memory are normal. She does not exhibit a depressed mood.   Diabetic foot exam:  Normal inspection  No skin breakdown  No calluses  Normal DP pulses  Normal sensation to light touch and monofilament  Nails normal

## 2014-03-26 NOTE — Assessment & Plan Note (Signed)
Due for re-eval. Hopefully better after visit to nutritionist.

## 2014-03-26 NOTE — Progress Notes (Signed)
Pre visit review using our clinic review tool, if applicable. No additional management support is needed unless otherwise documented below in the visit note. 

## 2014-03-26 NOTE — Assessment & Plan Note (Signed)
Excellent control today on current regimen.

## 2014-04-10 ENCOUNTER — Other Ambulatory Visit: Payer: Self-pay | Admitting: *Deleted

## 2014-04-10 MED ORDER — GLIPIZIDE ER 10 MG PO TB24: 10.0000 mg | ORAL_TABLET | Freq: Every day | ORAL | Status: DC

## 2014-04-10 MED ORDER — METFORMIN HCL ER 500 MG PO TB24: ORAL_TABLET | ORAL | Status: DC

## 2014-04-15 ENCOUNTER — Other Ambulatory Visit: Payer: Self-pay | Admitting: *Deleted

## 2014-04-15 MED ORDER — AMLODIPINE BESYLATE 5 MG PO TABS: ORAL_TABLET | ORAL | Status: DC

## 2014-04-15 MED ORDER — LOSARTAN POTASSIUM-HCTZ 100-25 MG PO TABS: 1.0000 | ORAL_TABLET | Freq: Every day | ORAL | Status: DC

## 2014-04-15 MED ORDER — TRAZODONE HCL 50 MG PO TABS: ORAL_TABLET | ORAL | Status: DC

## 2014-04-24 ENCOUNTER — Other Ambulatory Visit: Payer: Self-pay | Admitting: *Deleted

## 2014-04-24 MED ORDER — RAMIPRIL 10 MG PO CAPS: 10.0000 mg | ORAL_CAPSULE | Freq: Every day | ORAL | Status: DC

## 2014-09-19 ENCOUNTER — Other Ambulatory Visit: Payer: Self-pay | Admitting: Family Medicine

## 2014-10-09 ENCOUNTER — Other Ambulatory Visit: Payer: Self-pay | Admitting: Family Medicine

## 2015-01-14 ENCOUNTER — Ambulatory Visit: Payer: PRIVATE HEALTH INSURANCE | Admitting: Family Medicine

## 2015-01-21 ENCOUNTER — Telehealth: Payer: Self-pay | Admitting: Family Medicine

## 2015-01-21 DIAGNOSIS — E785 Hyperlipidemia, unspecified: Secondary | ICD-10-CM

## 2015-01-21 DIAGNOSIS — E119 Type 2 diabetes mellitus without complications: Secondary | ICD-10-CM

## 2015-01-21 NOTE — Telephone Encounter (Signed)
-----   Message from Alvina Chou sent at 01/13/2015  2:58 PM EDT ----- Regarding: Lab orders for Thursday, 9.29.16 Patient is scheduled for CPX labs, please order future labs, Thanks , Camelia Eng

## 2015-01-23 ENCOUNTER — Other Ambulatory Visit (INDEPENDENT_AMBULATORY_CARE_PROVIDER_SITE_OTHER): Payer: PRIVATE HEALTH INSURANCE

## 2015-01-23 DIAGNOSIS — E785 Hyperlipidemia, unspecified: Secondary | ICD-10-CM | POA: Diagnosis not present

## 2015-01-23 DIAGNOSIS — E119 Type 2 diabetes mellitus without complications: Secondary | ICD-10-CM

## 2015-01-23 LAB — COMPREHENSIVE METABOLIC PANEL
ALBUMIN: 4.2 g/dL (ref 3.5–5.2)
ALT: 16 U/L (ref 0–35)
AST: 15 U/L (ref 0–37)
Alkaline Phosphatase: 54 U/L (ref 39–117)
BILIRUBIN TOTAL: 0.9 mg/dL (ref 0.2–1.2)
BUN: 24 mg/dL — AB (ref 6–23)
CALCIUM: 10.1 mg/dL (ref 8.4–10.5)
CO2: 30 meq/L (ref 19–32)
CREATININE: 1.37 mg/dL — AB (ref 0.40–1.20)
Chloride: 102 mEq/L (ref 96–112)
GFR: 41.76 mL/min — ABNORMAL LOW (ref >60.00)
Glucose, Bld: 127 mg/dL — ABNORMAL HIGH (ref 70–99)
Potassium: 3.5 mEq/L (ref 3.5–5.1)
SODIUM: 139 meq/L (ref 135–145)
Total Protein: 8.1 g/dL (ref 6.0–8.3)

## 2015-01-23 LAB — LIPID PANEL
CHOLESTEROL: 167 mg/dL (ref 0–200)
HDL: 30.7 mg/dL — AB (ref >39.00)
LDL Cholesterol: 103 mg/dL — ABNORMAL HIGH (ref 0–99)
NONHDL: 135.81
TRIGLYCERIDES: 165 mg/dL — AB (ref 0.0–149.0)
Total CHOL/HDL Ratio: 5
VLDL: 33 mg/dL (ref 0.0–40.0)

## 2015-01-23 LAB — HEMOGLOBIN A1C: HEMOGLOBIN A1C: 7.4 % — AB (ref 4.6–6.5)

## 2015-01-28 ENCOUNTER — Telehealth: Payer: Self-pay | Admitting: Family Medicine

## 2015-01-28 NOTE — Telephone Encounter (Signed)
Patient had an appointment for a physical on 09/22/22.  Patient had a death in her family and has to go out of town.  Next available for a physical is in January.  Patient wants to know if she can be seen sooner.

## 2015-01-28 NOTE — Telephone Encounter (Signed)
Okay to work her in sooner in 30 min slot.

## 2015-01-30 ENCOUNTER — Encounter: Payer: PRIVATE HEALTH INSURANCE | Admitting: Family Medicine

## 2015-02-14 ENCOUNTER — Other Ambulatory Visit (HOSPITAL_COMMUNITY)
Admission: RE | Admit: 2015-02-14 | Discharge: 2015-02-14 | Disposition: A | Discharge status: Home / Self Care | Payer: PRIVATE HEALTH INSURANCE | Source: Ambulatory Visit | Attending: Family Medicine | Admitting: Family Medicine

## 2015-02-14 ENCOUNTER — Other Ambulatory Visit: Payer: Self-pay | Admitting: Family Medicine

## 2015-02-14 ENCOUNTER — Encounter: Payer: Self-pay | Admitting: Family Medicine

## 2015-02-14 ENCOUNTER — Ambulatory Visit (INDEPENDENT_AMBULATORY_CARE_PROVIDER_SITE_OTHER): Payer: PRIVATE HEALTH INSURANCE | Admitting: Family Medicine

## 2015-02-14 VITALS — BP 145/76 | HR 56 | Temp 97.8°F | Ht 65.5 in | Wt 170.8 lb

## 2015-02-14 DIAGNOSIS — N179 Acute kidney failure, unspecified: Secondary | ICD-10-CM | POA: Insufficient documentation

## 2015-02-14 DIAGNOSIS — N189 Chronic kidney disease, unspecified: Secondary | ICD-10-CM | POA: Diagnosis not present

## 2015-02-14 DIAGNOSIS — Z124 Encounter for screening for malignant neoplasm of cervix: Secondary | ICD-10-CM

## 2015-02-14 DIAGNOSIS — Z01419 Encounter for gynecological examination (general) (routine) without abnormal findings: Secondary | ICD-10-CM | POA: Insufficient documentation

## 2015-02-14 DIAGNOSIS — I10 Essential (primary) hypertension: Secondary | ICD-10-CM

## 2015-02-14 DIAGNOSIS — E785 Hyperlipidemia, unspecified: Secondary | ICD-10-CM

## 2015-02-14 DIAGNOSIS — Z23 Encounter for immunization: Secondary | ICD-10-CM

## 2015-02-14 DIAGNOSIS — Z1151 Encounter for screening for human papillomavirus (HPV): Secondary | ICD-10-CM | POA: Diagnosis not present

## 2015-02-14 DIAGNOSIS — Z1159 Encounter for screening for other viral diseases: Secondary | ICD-10-CM

## 2015-02-14 DIAGNOSIS — Z0000 Encounter for general adult medical examination without abnormal findings: Secondary | ICD-10-CM

## 2015-02-14 DIAGNOSIS — Z114 Encounter for screening for human immunodeficiency virus [HIV]: Secondary | ICD-10-CM

## 2015-02-14 DIAGNOSIS — Z1211 Encounter for screening for malignant neoplasm of colon: Secondary | ICD-10-CM

## 2015-02-14 DIAGNOSIS — R87810 Cervical high risk human papillomavirus (HPV) DNA test positive: Secondary | ICD-10-CM | POA: Insufficient documentation

## 2015-02-14 DIAGNOSIS — E119 Type 2 diabetes mellitus without complications: Secondary | ICD-10-CM

## 2015-02-14 LAB — BASIC METABOLIC PANEL
BUN: 20 mg/dL (ref 6–23)
CALCIUM: 9.7 mg/dL (ref 8.4–10.5)
CO2: 29 meq/L (ref 19–32)
CREATININE: 1.27 mg/dL — AB (ref 0.40–1.20)
Chloride: 103 mEq/L (ref 96–112)
GFR: 45.56 mL/min — ABNORMAL LOW (ref >60.00)
GLUCOSE: 123 mg/dL — AB (ref 70–99)
Potassium: 3.5 mEq/L (ref 3.5–5.1)
Sodium: 140 mEq/L (ref 135–145)

## 2015-02-14 LAB — HM DIABETES FOOT EXAM

## 2015-02-14 MED ORDER — METFORMIN HCL ER 500 MG PO TB24: ORAL_TABLET | ORAL | Status: DC

## 2015-02-14 NOTE — Assessment & Plan Note (Signed)
Almost at goal on current med.
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
## 2015-02-14 NOTE — Assessment & Plan Note (Signed)
Get better DM control and BP control. Recheck Cr today.  Sister with kidney failure from DM.

## 2015-02-14 NOTE — Progress Notes (Signed)
Pre visit review using our clinic review tool, if applicable. No additional management support is needed unless otherwise documented below in the visit note. 

## 2015-02-14 NOTE — Addendum Note (Signed)
Addended by: Damita LackLORING, Yusuke Beza S on: 02/14/2015 09:23 AM   Modules accepted: Orders

## 2015-02-14 NOTE — Addendum Note (Signed)
Addended by: Damita LackLORING, Jazyiah Yiu S on: 02/14/2015 09:41 AM   Modules accepted: Orders

## 2015-02-14 NOTE — Patient Instructions (Addendum)
Follow BP at home several times in the next 2 weeks.. Call if > 140/90.  Work on low cholesterol diet, change to veggie fats. Get back on track with regular. In next few weeks increase metformin to 2 tabs daily. Stop at lab on way out. Schedule mammogram on your own. Call insurance about coverage of shingles vaccine.  Set up yearly eye exam.

## 2015-02-14 NOTE — Assessment & Plan Note (Signed)
INadequate control. INcrease metformin too 1000 mg daily. Recheck in 3 months.Encouraged exercise, weight loss, healthy eating habits.

## 2015-02-14 NOTE — Assessment & Plan Note (Signed)
Boredline elevated today... Check at home. Call if > 140/90. Conitnue current meds.

## 2015-02-15 LAB — HEPATITIS C ANTIBODY: HCV AB: NEGATIVE

## 2015-02-15 LAB — HIV ANTIBODY (ROUTINE TESTING W REFLEX): HIV: NONREACTIVE

## 2015-02-17 LAB — CYTOLOGY - PAP

## 2015-02-18 ENCOUNTER — Encounter: Payer: Self-pay | Admitting: *Deleted

## 2015-02-27 ENCOUNTER — Telehealth: Payer: Self-pay | Admitting: Family Medicine

## 2015-02-27 DIAGNOSIS — R87810 Cervical high risk human papillomavirus (HPV) DNA test positive: Secondary | ICD-10-CM

## 2015-02-27 NOTE — Addendum Note (Signed)
Addended byKerby Nora: Georgeanne Frankland E on: 02/27/2015 05:23 PM   Modules accepted: SmartSet

## 2015-02-27 NOTE — Telephone Encounter (Signed)
-----   Message from Damita Lackonna S Loring, CMA sent at 02/27/2015  4:22 PM EDT ----- Ms. Luiz BlareGraves notified as instructed by telephone.  She is agreeable to the referral to GYN.

## 2015-03-23 ENCOUNTER — Other Ambulatory Visit: Payer: Self-pay | Admitting: Family Medicine

## 2015-03-25 LAB — HM DIABETES EYE EXAM

## 2015-04-15 ENCOUNTER — Encounter: Payer: Self-pay | Admitting: Family Medicine

## 2015-04-18 ENCOUNTER — Other Ambulatory Visit: Payer: Self-pay | Admitting: Family Medicine

## 2015-05-09 ENCOUNTER — Telehealth: Payer: Self-pay | Admitting: Family Medicine

## 2015-05-09 ENCOUNTER — Other Ambulatory Visit (INDEPENDENT_AMBULATORY_CARE_PROVIDER_SITE_OTHER): Payer: PRIVATE HEALTH INSURANCE

## 2015-05-09 ENCOUNTER — Ambulatory Visit (INDEPENDENT_AMBULATORY_CARE_PROVIDER_SITE_OTHER): Payer: PRIVATE HEALTH INSURANCE | Admitting: Family Medicine

## 2015-05-09 ENCOUNTER — Encounter: Payer: Self-pay | Admitting: Family Medicine

## 2015-05-09 ENCOUNTER — Ambulatory Visit (INDEPENDENT_AMBULATORY_CARE_PROVIDER_SITE_OTHER)
Admission: RE | Admit: 2015-05-09 | Discharge: 2015-05-09 | Disposition: A | Discharge status: Home / Self Care | Payer: PRIVATE HEALTH INSURANCE | Source: Ambulatory Visit | Attending: Family Medicine | Admitting: Family Medicine

## 2015-05-09 ENCOUNTER — Other Ambulatory Visit: Payer: PRIVATE HEALTH INSURANCE

## 2015-05-09 VITALS — BP 120/80 | HR 64 | Temp 98.2°F | Ht 65.5 in | Wt 170.5 lb

## 2015-05-09 DIAGNOSIS — M79671 Pain in right foot: Secondary | ICD-10-CM

## 2015-05-09 DIAGNOSIS — E119 Type 2 diabetes mellitus without complications: Secondary | ICD-10-CM

## 2015-05-09 DIAGNOSIS — N183 Chronic kidney disease, stage 3 unspecified: Secondary | ICD-10-CM

## 2015-05-09 DIAGNOSIS — W108XXA Fall (on) (from) other stairs and steps, initial encounter: Secondary | ICD-10-CM | POA: Diagnosis not present

## 2015-05-09 LAB — COMPREHENSIVE METABOLIC PANEL
ALBUMIN: 4.4 g/dL (ref 3.5–5.2)
ALT: 17 U/L (ref 0–35)
AST: 18 U/L (ref 0–37)
Alkaline Phosphatase: 60 U/L (ref 39–117)
BUN: 22 mg/dL (ref 6–23)
CHLORIDE: 102 meq/L (ref 96–112)
CO2: 30 meq/L (ref 19–32)
CREATININE: 1.24 mg/dL — AB (ref 0.40–1.20)
Calcium: 10.2 mg/dL (ref 8.4–10.5)
GFR: 46.8 mL/min — ABNORMAL LOW (ref >60.00)
Glucose, Bld: 115 mg/dL — ABNORMAL HIGH (ref 70–99)
Potassium: 3.7 mEq/L (ref 3.5–5.1)
SODIUM: 138 meq/L (ref 135–145)
Total Bilirubin: 1 mg/dL (ref 0.2–1.2)
Total Protein: 8.2 g/dL (ref 6.0–8.3)

## 2015-05-09 LAB — HEMOGLOBIN A1C: HEMOGLOBIN A1C: 7 % — AB (ref 4.6–6.5)

## 2015-05-09 NOTE — Patient Instructions (Signed)
Foot Sprain A foot sprain is an injury to one of the strong bands of tissue (ligaments) that connect and support the many bones in your feet. The ligament can be stretched too much or it can tear. A tear can be either partial or complete. The severity of the sprain depends on how much of the ligament was damaged or torn.  Can use tylenol, limit ibuprofen and aleve given renal function. Elevate, start home stretches.  Call if not improving as expected.

## 2015-05-09 NOTE — Progress Notes (Signed)
   Subjective:    Patient ID: Dana Rogers, female    DOB: 03-Mar-1956, 60 y.o.   MRN: [id number]  HPI   60 year old female presents with new onset pain in right foot following fall on 04/27/2015.  Lost balance going down the steps.  No head injury.  Was not able to weight bear without assistance on right ankle.  No ice at that time.,  Took advil and soaked it the next day. Helps some.  Was on tour so kept walking.  Initial swelling, no bruising.  Swelling has decreased a little but still present and now with standing , walking causes pain in top of right foot. Pain not improving.  Not wearing brace.  Social History /Family History/Past Medical History reviewed and updated if needed. No history of ankle foot issues.   Review of Systems  Constitutional: Negative for fever and fatigue.  HENT: Negative for ear pain.   Eyes: Negative for pain.  Respiratory: Negative for chest tightness and shortness of breath.   Cardiovascular: Negative for chest pain, palpitations and leg swelling.  Gastrointestinal: Negative for abdominal pain.  Genitourinary: Negative for dysuria.       Objective:   Physical Exam  Constitutional: Vital signs are normal. She appears well-developed and well-nourished. She is cooperative.  Non-toxic appearance. She does not appear ill. No distress.  HENT:  Head: Normocephalic.  Right Ear: Hearing, tympanic membrane, external ear and ear canal normal. Tympanic membrane is not erythematous, not retracted and not bulging.  Left Ear: Hearing, tympanic membrane, external ear and ear canal normal. Tympanic membrane is not erythematous, not retracted and not bulging.  Nose: No mucosal edema or rhinorrhea. Right sinus exhibits no maxillary sinus tenderness and no frontal sinus tenderness. Left sinus exhibits no maxillary sinus tenderness and no frontal sinus tenderness.  Mouth/Throat: Uvula is midline, oropharynx is clear and moist and mucous membranes are normal.    Eyes: Conjunctivae, EOM and lids are normal. Pupils are equal, round, and reactive to light. Lids are everted and swept, no foreign bodies found.  Neck: Trachea normal and normal range of motion. Neck supple. Carotid bruit is not present. No thyroid mass and no thyromegaly present.  Cardiovascular: Normal rate, regular rhythm, S1 normal, S2 normal, normal heart sounds, intact distal pulses and normal pulses.  Exam reveals no gallop and no friction rub.   No murmur heard. Pulmonary/Chest: Effort normal and breath sounds normal. No tachypnea. No respiratory distress. She has no decreased breath sounds. She has no wheezes. She has no rhonchi. She has no rales.  Abdominal: Soft. Normal appearance and bowel sounds are normal. There is no tenderness.  Musculoskeletal:       Right ankle: Normal. She exhibits normal range of motion. No tenderness. No lateral malleolus and no medial malleolus tenderness found.       Left ankle: Normal. Achilles tendon normal.       Right foot: There is tenderness, bony tenderness and swelling. There is normal range of motion.       Left foot: Normal.  Neurological: She is alert.  Skin: Skin is warm, dry and intact. No rash noted.  Psychiatric: Her speech is normal and behavior is normal. Judgment and thought content normal. Her mood appears not anxious. Cognition and memory are normal. She does not exhibit a depressed mood.          Assessment & Plan:

## 2015-05-09 NOTE — Progress Notes (Signed)
Pre visit review using our clinic review tool, if applicable. No additional management support is needed unless otherwise documented below in the visit note. 

## 2015-05-09 NOTE — Assessment & Plan Note (Signed)
No clear fracture on X-ray. Most likly MSK strain. Can use tylenol for pain, occ NSAID but limit given renal function.  Elevated, start gentle stretching, ROM exercises. Wear post op shoe to decrease pressure on foot.

## 2015-05-09 NOTE — Telephone Encounter (Signed)
-----   Message from Alvina Chouerri J Walsh sent at 04/29/2015 12:19 PM EST ----- Regarding: Lab orders for Friday, 1.13.17 Lab orders for a 3 month follow up appt.

## 2015-05-16 ENCOUNTER — Ambulatory Visit (INDEPENDENT_AMBULATORY_CARE_PROVIDER_SITE_OTHER): Payer: PRIVATE HEALTH INSURANCE | Admitting: Family Medicine

## 2015-05-16 ENCOUNTER — Encounter: Payer: Self-pay | Admitting: Family Medicine

## 2015-05-16 VITALS — BP 140/82 | HR 70 | Temp 98.3°F | Ht 65.5 in | Wt 170.8 lb

## 2015-05-16 DIAGNOSIS — E119 Type 2 diabetes mellitus without complications: Secondary | ICD-10-CM | POA: Diagnosis not present

## 2015-05-16 DIAGNOSIS — I10 Essential (primary) hypertension: Secondary | ICD-10-CM

## 2015-05-16 NOTE — Progress Notes (Signed)
Pre visit review using our clinic review tool, if applicable. No additional management support is needed unless otherwise documented below in the visit note. 

## 2015-05-16 NOTE — Assessment & Plan Note (Signed)
Now at goal A1C at 7! She has not really increased the metformin much, has done all this with lifestyle changes.

## 2015-05-16 NOTE — Progress Notes (Signed)
  60 year old female presents for 3 month follow up DM.  Hypertension: Borderline  BP Readings from Last 3 Encounters:   BP Readings from Last 3 Encounters:  05/16/15 140/82  05/09/15 120/80  02/14/15 145/76  Using medication without problems or lightheadedness: None Chest pain with exertion: None  Edema:None  Short of breath: none Average home 1400/80 Other issues: 2014 stress test low risk.     Diabetes: Improved control on  glipizide and  With increase in metformin.  She has forgotten increase frequently. She has tried to do better with her eating overall. Lab Results  Component Value Date   HGBA1C 7.0* 05/09/2015  Using medications without difficulties: Yes  Hypoglycemic episodes:None  Hyperglycemic episodes:None  Feet problems:None Blood Sugars averaging: not checking CBG..Plan  to start checking. eye exam within last year: yes  no exercise.   CKD.Marland Kitchennow improved and back at baseline.  Review of Systems  Constitutional: Negative for fever and fatigue.  HENT: Negative for ear pain.  Eyes: Negative for pain.  Respiratory: Negative for chest tightness and shortness of breath.  Cardiovascular: Negative for chest pain, palpitations and leg swelling.  Gastrointestinal: Negative for abdominal pain.  Genitourinary: Negative for dysuria.  Objective:   Physical Exam  Constitutional: Vital signs are normal. She appears well-developed and well-nourished. She is cooperative. Non-toxic appearance. She does not appear ill. No distress.  HENT:  Head: Normocephalic.  Nose: No mucosal edema or rhinorrhea. Right sinus exhibits no maxillary sinus tenderness and no frontal sinus tenderness. Left sinus exhibits no maxillary sinus tenderness and no frontal sinus tenderness.  Mouth/Throat: Uvula is midline, oropharynx is clear and moist and mucous membranes are normal.  Eyes: Conjunctivae, EOM and lids are normal. Pupils are equal, round, and reactive to light.  No foreign bodies found.  Neck: Trachea normal and normal range of motion. Neck supple. Carotid bruit is not present. No mass and no thyromegaly present.  Cardiovascular: Normal rate, regular rhythm, S1 normal, S2 normal, normal heart sounds, intact distal pulses and normal pulses. Exam reveals no gallop and no friction rub.  No murmur heard.  Pulmonary/Chest: Effort normal and breath sounds normal. Not tachypneic. No respiratory distress. She has no decreased breath sounds. She has no wheezes. She has no rhonchi. She has no rales.   Neurological: She is alert.  Skin: Skin is warm, dry and intact. No rash noted.  Psychiatric: She has a normal mood and affect. Her speech is normal and behavior is normal. Judgment and thought content normal. Her mood appears not anxious. Cognition and memory are normal. She does not exhibit a depressed mood.   Diabetic foot exam:  Normal inspection  No skin breakdown  No calluses  Normal DP pulses  Normal sensation to light touch and monofilament  Nails normal

## 2015-05-16 NOTE — Assessment & Plan Note (Signed)
Borderline control on current regimen. Work on low salt diet and weight loss.  Follow.

## 2015-05-16 NOTE — Patient Instructions (Addendum)
Keep up the great work with healthy eating and start regular exercise.  Work on low salt diet; < 1500 mg daily to lower blood pressure lower

## 2015-11-11 ENCOUNTER — Telehealth: Payer: Self-pay | Admitting: Family Medicine

## 2015-11-11 DIAGNOSIS — E785 Hyperlipidemia, unspecified: Secondary | ICD-10-CM

## 2015-11-11 DIAGNOSIS — E119 Type 2 diabetes mellitus without complications: Secondary | ICD-10-CM

## 2015-11-11 NOTE — Telephone Encounter (Signed)
-----   Message from Baldomero LamyNatasha C Chavers sent at 11/06/2015  9:26 AM EDT ----- Regarding: 6 mo f/u labs 7/19, need orders. Thanks! :-) Please order future 6 mo f/u labs for pt's upcoming lab appt. Thanks Rodney Boozeasha

## 2015-11-12 ENCOUNTER — Other Ambulatory Visit (INDEPENDENT_AMBULATORY_CARE_PROVIDER_SITE_OTHER): Payer: BLUE CROSS/BLUE SHIELD

## 2015-11-12 DIAGNOSIS — E785 Hyperlipidemia, unspecified: Secondary | ICD-10-CM | POA: Diagnosis not present

## 2015-11-12 DIAGNOSIS — E119 Type 2 diabetes mellitus without complications: Secondary | ICD-10-CM

## 2015-11-12 LAB — LIPID PANEL
Cholesterol: 186 mg/dL (ref 0–200)
HDL: 35.9 mg/dL — ABNORMAL LOW (ref >39.00)
LDL Cholesterol: 123 mg/dL — ABNORMAL HIGH (ref 0–99)
NONHDL: 150.58
Total CHOL/HDL Ratio: 5
Triglycerides: 139 mg/dL (ref 0.0–149.0)
VLDL: 27.8 mg/dL (ref 0.0–40.0)

## 2015-11-12 LAB — COMPREHENSIVE METABOLIC PANEL
ALBUMIN: 4.6 g/dL (ref 3.5–5.2)
ALK PHOS: 60 U/L (ref 39–117)
ALT: 19 U/L (ref 0–35)
AST: 18 U/L (ref 0–37)
BUN: 21 mg/dL (ref 6–23)
CHLORIDE: 101 meq/L (ref 96–112)
CO2: 30 mEq/L (ref 19–32)
CREATININE: 1.17 mg/dL (ref 0.40–1.20)
Calcium: 10.3 mg/dL (ref 8.4–10.5)
GFR: 49.96 mL/min — ABNORMAL LOW (ref >60.00)
Glucose, Bld: 156 mg/dL — ABNORMAL HIGH (ref 70–99)
Potassium: 3.3 mEq/L — ABNORMAL LOW (ref 3.5–5.1)
SODIUM: 138 meq/L (ref 135–145)
TOTAL PROTEIN: 8.3 g/dL (ref 6.0–8.3)
Total Bilirubin: 1 mg/dL (ref 0.2–1.2)

## 2015-11-12 LAB — HEMOGLOBIN A1C: HEMOGLOBIN A1C: 7.9 % — AB (ref 4.6–6.5)

## 2015-11-14 ENCOUNTER — Ambulatory Visit: Payer: PRIVATE HEALTH INSURANCE | Admitting: Family Medicine

## 2015-11-20 ENCOUNTER — Ambulatory Visit (INDEPENDENT_AMBULATORY_CARE_PROVIDER_SITE_OTHER): Payer: 59 | Admitting: Family Medicine

## 2015-11-20 ENCOUNTER — Encounter: Payer: Self-pay | Admitting: Family Medicine

## 2015-11-20 VITALS — BP 126/86 | HR 67 | Temp 98.3°F | Ht 65.5 in | Wt 171.5 lb

## 2015-11-20 DIAGNOSIS — E785 Hyperlipidemia, unspecified: Secondary | ICD-10-CM

## 2015-11-20 DIAGNOSIS — I10 Essential (primary) hypertension: Secondary | ICD-10-CM

## 2015-11-20 DIAGNOSIS — M545 Low back pain, unspecified: Secondary | ICD-10-CM | POA: Insufficient documentation

## 2015-11-20 DIAGNOSIS — E119 Type 2 diabetes mellitus without complications: Secondary | ICD-10-CM | POA: Diagnosis not present

## 2015-11-20 DIAGNOSIS — N183 Chronic kidney disease, stage 3 unspecified: Secondary | ICD-10-CM

## 2015-11-20 DIAGNOSIS — N1831 Chronic kidney disease, stage 3a: Secondary | ICD-10-CM | POA: Insufficient documentation

## 2015-11-20 DIAGNOSIS — N189 Chronic kidney disease, unspecified: Secondary | ICD-10-CM | POA: Insufficient documentation

## 2015-11-20 DIAGNOSIS — G8929 Other chronic pain: Secondary | ICD-10-CM

## 2015-11-20 LAB — HM DIABETES FOOT EXAM

## 2015-11-20 MED ORDER — DICLOFENAC SODIUM 75 MG PO TBEC: 75.0000 mg | DELAYED_RELEASE_TABLET | Freq: Two times a day (BID) | ORAL | 0 refills | Status: DC

## 2015-11-20 NOTE — Assessment & Plan Note (Addendum)
Borderline control on amlodipine and losartan HCTZ. She has been eating excessive salt.  On recheck after sitting a while ( was late and rush here  , and letting medication kick in) BP at goal.

## 2015-11-20 NOTE — Patient Instructions (Addendum)
Given slightly decreased kidney function.. Only use diclofenac occasionally for low back pain. If pain persistent, return for further eval.  Get back to low carb, low chol diet, add back exercise  (walk).  Decrease eating out and portion size. Decrease biscuit and [facility]. Follow BP at home to make sure at < 140/90 regularly.

## 2015-11-20 NOTE — Progress Notes (Signed)
Pre visit review using our clinic review tool, if applicable. No additional management support is needed unless otherwise documented below in the visit note. 

## 2015-11-20 NOTE — Assessment & Plan Note (Signed)
Stable control. Needs improved DM control. On ACEI

## 2015-11-20 NOTE — Assessment & Plan Note (Signed)
No longer at goal.. Get back to healthy lifestyle. Re eval in 3 months. If not at goal start statin medication.

## 2015-11-20 NOTE — Progress Notes (Signed)
60 year old female presents for 6 month follow up DM.  She has chronic low back pain. Has occ low back , no radiation of pain.  No weakness, no numbness. Diclofenac prn , helps.. Needs refill.  Does not use regularly. 3 times per month.  Hypertension: Not at goal today on losartan HCTZ and amlodipine. She did take only 15 min before measurment. On repeat: 128/86 BP Readings from Last 3 Encounters:  11/20/15 (!) 146/94  05/16/15 140/82  05/09/15 120/80  Using medication without problems or lightheadedness: None Chest pain with exertion: None, occ sharp pain at rest. Edema:None  Short of breath: none Average home: not checking Other issues: 2014 stress test low risk.    Diabetes: Worsened control in last 6 months  glipizide and  metformin.    She has been eating out more in summer. Lab Results  Component Value Date   HGBA1C 7.9 (H) 11/12/2015  Using medications without difficulties: Yes  Hypoglycemic episodes:None  Hyperglycemic episodes:None  Feet problems:None Blood Sugars averaging: not checking CBG..Plan  to start checking. eye exam within last year: yes 02/2016  no exercise.   CKD.Marland Kitchennow improved and back at baseline.  Wt Readings from Last 3 Encounters:  11/20/15 171 lb 8 oz (77.8 kg)  05/16/15 170 lb 12 oz (77.5 kg)  05/09/15 170 lb 8 oz (77.3 kg)    Elevated Cholesterol:  LDL not at goal on no medication Lab Results  Component Value Date   CHOL 186 11/12/2015   HDL 35.90 (L) 11/12/2015   LDLCALC 123 (H) 11/12/2015   TRIG 139.0 11/12/2015   CHOLHDL 5 11/12/2015  Using medications without problems: Muscle aches:  Diet compliance: None Exercise:None Other complaints:   Social History /Family History/Past Medical History reviewed and updated if needed.   Review of Systems  Constitutional: Negative for fever and fatigue.  HENT: Negative for ear pain.  Eyes: Negative for pain.  Respiratory: Negative for chest tightness and shortness of  breath.  Cardiovascular: Negative for chest pain, palpitations and leg swelling.  Gastrointestinal: Negative for abdominal pain.  Genitourinary: Negative for dysuria.  Objective:   Physical Exam  Constitutional: Vital signs are normal. She appears well-developed and well-nourished. She is cooperative. Non-toxic appearance. She does not appear ill. No distress.  HENT:  Head: Normocephalic.  Nose: No mucosal edema or rhinorrhea. Right sinus exhibits no maxillary sinus tenderness and no frontal sinus tenderness. Left sinus exhibits no maxillary sinus tenderness and no frontal sinus tenderness.  Mouth/Throat: Uvula is midline, oropharynx is clear and moist and mucous membranes are normal.  Eyes: Conjunctivae, EOM and lids are normal. Pupils are equal, round, and reactive to light. No foreign bodies found.  Neck: Trachea normal and normal range of motion. Neck supple. Carotid bruit is not present. No mass and no thyromegaly present.  Cardiovascular: Normal rate, regular rhythm, S1 normal, S2 normal, normal heart sounds, intact distal pulses and normal pulses. Exam reveals no gallop and no friction rub.  No murmur heard.  Pulmonary/Chest: Effort normal and breath sounds normal. Not tachypneic. No respiratory distress. She has no decreased breath sounds. She has no wheezes. She has no rhonchi. She has no rales.  Neurological: She is alert.  Skin: Skin is warm, dry and intact. No rash noted.  Psychiatric: She has a normal mood and affect. Her speech is normal and behavior is normal. Judgment and thought content normal. Her mood appears not anxious. Cognition and memory are normal. She does not exhibit a depressed mood.  Diabetic foot exam:  Normal inspection  No skin breakdown  No calluses  Normal DP pulses  Normal sensation to light touch and monofilament  Nails normal

## 2015-11-20 NOTE — Assessment & Plan Note (Signed)
Inadequate control on same medications with better diet. Encouraged exercise, weight loss, healthy eating habits.

## 2015-11-20 NOTE — Assessment & Plan Note (Signed)
Limited use of diclofenac given CKD.

## 2016-01-20 ENCOUNTER — Other Ambulatory Visit: Payer: Self-pay | Admitting: Family Medicine

## 2016-01-28 ENCOUNTER — Other Ambulatory Visit: Payer: Self-pay | Admitting: Family Medicine

## 2016-02-15 ENCOUNTER — Telehealth: Payer: Self-pay | Admitting: Family Medicine

## 2016-02-15 DIAGNOSIS — E119 Type 2 diabetes mellitus without complications: Secondary | ICD-10-CM

## 2016-02-15 NOTE — Telephone Encounter (Signed)
-----   Message from Alvina Chouerri J Walsh sent at 02/10/2016 11:34 AM EDT ----- Regarding: Lab orders for Monday, 10.23.17 Lab orders for a f/u appt

## 2016-02-16 ENCOUNTER — Other Ambulatory Visit: Payer: 59

## 2016-02-20 ENCOUNTER — Other Ambulatory Visit (INDEPENDENT_AMBULATORY_CARE_PROVIDER_SITE_OTHER): Payer: BLUE CROSS/BLUE SHIELD

## 2016-02-20 ENCOUNTER — Ambulatory Visit: Payer: 59 | Admitting: Family Medicine

## 2016-02-20 DIAGNOSIS — E119 Type 2 diabetes mellitus without complications: Secondary | ICD-10-CM | POA: Diagnosis not present

## 2016-02-20 LAB — LIPID PANEL
CHOLESTEROL: 183 mg/dL (ref 0–200)
HDL: 35.3 mg/dL — ABNORMAL LOW (ref >39.00)
LDL Cholesterol: 122 mg/dL — ABNORMAL HIGH (ref 0–99)
NonHDL: 147.52
TRIGLYCERIDES: 130 mg/dL (ref 0.0–149.0)
Total CHOL/HDL Ratio: 5
VLDL: 26 mg/dL (ref 0.0–40.0)

## 2016-02-20 LAB — COMPREHENSIVE METABOLIC PANEL
ALK PHOS: 47 U/L (ref 39–117)
ALT: 17 U/L (ref 0–35)
AST: 18 U/L (ref 0–37)
Albumin: 4.5 g/dL (ref 3.5–5.2)
BILIRUBIN TOTAL: 1 mg/dL (ref 0.2–1.2)
BUN: 21 mg/dL (ref 6–23)
CALCIUM: 10 mg/dL (ref 8.4–10.5)
CO2: 29 mEq/L (ref 19–32)
Chloride: 103 mEq/L (ref 96–112)
Creatinine, Ser: 1.27 mg/dL — ABNORMAL HIGH (ref 0.40–1.20)
GFR: 45.41 mL/min — AB (ref >60.00)
Glucose, Bld: 145 mg/dL — ABNORMAL HIGH (ref 70–99)
POTASSIUM: 3.6 meq/L (ref 3.5–5.1)
Sodium: 139 mEq/L (ref 135–145)
TOTAL PROTEIN: 8.1 g/dL (ref 6.0–8.3)

## 2016-02-20 LAB — HEMOGLOBIN A1C: Hgb A1c MFr Bld: 7.8 % — ABNORMAL HIGH (ref 4.6–6.5)

## 2016-02-24 ENCOUNTER — Ambulatory Visit (INDEPENDENT_AMBULATORY_CARE_PROVIDER_SITE_OTHER): Payer: BLUE CROSS/BLUE SHIELD | Admitting: Family Medicine

## 2016-02-24 ENCOUNTER — Encounter: Payer: Self-pay | Admitting: Family Medicine

## 2016-02-24 VITALS — BP 130/90 | HR 61 | Temp 98.3°F | Ht 65.5 in | Wt 170.5 lb

## 2016-02-24 DIAGNOSIS — E119 Type 2 diabetes mellitus without complications: Secondary | ICD-10-CM | POA: Diagnosis not present

## 2016-02-24 DIAGNOSIS — I10 Essential (primary) hypertension: Secondary | ICD-10-CM | POA: Diagnosis not present

## 2016-02-24 DIAGNOSIS — Z2911 Encounter for prophylactic immunotherapy for respiratory syncytial virus (RSV): Secondary | ICD-10-CM

## 2016-02-24 DIAGNOSIS — N183 Chronic kidney disease, stage 3 unspecified: Secondary | ICD-10-CM

## 2016-02-24 DIAGNOSIS — E785 Hyperlipidemia, unspecified: Secondary | ICD-10-CM

## 2016-02-24 DIAGNOSIS — Z23 Encounter for immunization: Secondary | ICD-10-CM | POA: Diagnosis not present

## 2016-02-24 LAB — HM DIABETES FOOT EXAM

## 2016-02-24 MED ORDER — ATORVASTATIN CALCIUM 20 MG PO TABS: 20.0000 mg | ORAL_TABLET | Freq: Every day | ORAL | 3 refills | Status: DC

## 2016-02-24 NOTE — Patient Instructions (Addendum)
Increase metformin to 2 tabs daily to get better .  Make sure to keep up with water.  Start atorvastatin 20 mg daily.

## 2016-02-24 NOTE — Addendum Note (Signed)
Addended by: Damita LackLORING, DONNA S on: 02/24/2016 09:03 AM   Modules accepted: Orders

## 2016-02-24 NOTE — Progress Notes (Signed)
Pre visit review using our clinic review tool, if applicable. No additional management support is needed unless otherwise documented below in the visit note. 

## 2016-02-24 NOTE — Assessment & Plan Note (Signed)
Well controlled. Continue current medication.  

## 2016-02-24 NOTE — Assessment & Plan Note (Signed)
Slight worsening control.Dana Rogers. Urged importance of DM and HTN conbtrol. Offered referral to nephrology.. Pt declined at this time.  No urgent need for referral.

## 2016-02-24 NOTE — Assessment & Plan Note (Signed)
Make sure to actually take metformin 2 tabs daily. As well as glipizide. Encouraged exercise, weight loss, healthy eating habits.

## 2016-02-24 NOTE — Progress Notes (Signed)
60 year old female presents for 3 month follow up DM.  Hypertension: Almost at goal today on losartan HCTZ and amlodipine. BP Readings from Last 3 Encounters:  02/24/16 130/90  11/20/15 126/86  05/16/15 140/82  Using medication without problems or lightheadedness: None Chest pain with exertion: None, occ sharp pain at rest. Edema:None  Short of breath: none Average home: not checking Other issues: 2014 stress test low risk.   Diabetes: Not improvement in control on glipizide and  metformin ( she is only taking one tablet daily).   Lab Results  Component Value Date   HGBA1C 7.8 (H) 02/20/2016  Using medications without difficulties: Yes  Hypoglycemic episodes:None  Hyperglycemic episodes:None  Feet problems:None Blood Sugars averaging: not checking CBG..Plan to start checking. eye exam within last year: yes 02/2015 NO exercise.  CKD, likely due to DM.Marland Kitchen. Sister with diabetic kidney failure.  Elevated Cholesterol:  LDL not at goal on no medication Lab Results  Component Value Date   CHOL 183 02/20/2016   HDL 35.30 (L) 02/20/2016   LDLCALC 122 (H) 02/20/2016   TRIG 130.0 02/20/2016   CHOLHDL 5 02/20/2016  Using medications without problems: Muscle aches:  Diet compliance: None Exercise:None Other complaints:   Social History /Family History/Past Medical History reviewed and updated if needed.   Review of Systems  Constitutional: Negative for fever and fatigue.  HENT: Negative for ear pain.  Eyes: Negative for pain.  Respiratory: Negative for chest tightness and shortness of breath.  Cardiovascular: Negative for chest pain, palpitations and leg swelling.  Gastrointestinal: Negative for abdominal pain.  Genitourinary: Negative for dysuria.  Objective:  Physical Exam Constitutional: Vital signs are normal. She appears well-developed and well-nourished. She is cooperative. Non-toxic appearance. She does not appear ill. No distress.   HENT:  Head: Normocephalic.  Nose: No mucosal edema or rhinorrhea. Right sinus exhibits no maxillary sinus tenderness and no frontal sinus tenderness. Left sinus exhibits no maxillary sinus tenderness and no frontal sinus tenderness.  Mouth/Throat: Uvula is midline, oropharynx is clear and moist and mucous membranes are normal.  Eyes: Conjunctivae, EOM and lids are normal. Pupils are equal, round, and reactive to light. No foreign bodies found.  Neck: Trachea normal and normal range of motion. Neck supple. Carotid bruit is not present. No mass and no thyromegaly present.  Cardiovascular: Normal rate, regular rhythm, S1 normal, S2 normal, normal heart sounds, intact distal pulses and normal pulses. Exam reveals no gallop and no friction rub.  No murmur heard.  Pulmonary/Chest: Effort normal and breath sounds normal. Not tachypneic. No respiratory distress. She has no decreased breath sounds. She has no wheezes. She has no rhonchi. She has no rales.  Neurological: She is alert.  Skin: Skin is warm, dry and intact. No rash noted.  Psychiatric: She has a normal mood and affect. Her speech is normal and behavior is normal. Judgment and thought content normal. Her mood appears not anxious. Cognition and memory are normal. She does not exhibit a depressed mood.   Diabetic foot exam:  Normal inspection  No skin breakdown  No calluses  Normal DP pulses  Normal sensation to light touch and monofilament  Nails normal

## 2016-02-24 NOTE — Assessment & Plan Note (Signed)
Start low dose atorvastatin daily.

## 2016-04-30 ENCOUNTER — Other Ambulatory Visit: Payer: Self-pay | Admitting: Family Medicine

## 2016-05-15 ENCOUNTER — Telehealth: Payer: Self-pay | Admitting: Family Medicine

## 2016-05-15 DIAGNOSIS — E785 Hyperlipidemia, unspecified: Secondary | ICD-10-CM

## 2016-05-15 DIAGNOSIS — N183 Chronic kidney disease, stage 3 unspecified: Secondary | ICD-10-CM

## 2016-05-15 DIAGNOSIS — E119 Type 2 diabetes mellitus without complications: Secondary | ICD-10-CM

## 2016-05-15 NOTE — Telephone Encounter (Signed)
-----   Message from Alvina Chouerri J Walsh sent at 05/05/2016  5:59 PM EST ----- Regarding: Lab orders for Wednesday, 1.24.18 Lab orders for a 3 month follow up appt.

## 2016-05-19 ENCOUNTER — Other Ambulatory Visit (INDEPENDENT_AMBULATORY_CARE_PROVIDER_SITE_OTHER): Payer: BLUE CROSS/BLUE SHIELD

## 2016-05-19 DIAGNOSIS — N183 Chronic kidney disease, stage 3 unspecified: Secondary | ICD-10-CM

## 2016-05-19 DIAGNOSIS — E785 Hyperlipidemia, unspecified: Secondary | ICD-10-CM

## 2016-05-19 DIAGNOSIS — E119 Type 2 diabetes mellitus without complications: Secondary | ICD-10-CM | POA: Diagnosis not present

## 2016-05-19 LAB — LIPID PANEL
CHOL/HDL RATIO: 3
Cholesterol: 119 mg/dL (ref 0–200)
HDL: 37.6 mg/dL — ABNORMAL LOW (ref >39.00)
LDL CALC: 66 mg/dL (ref 0–99)
NONHDL: 81.87
Triglycerides: 77 mg/dL (ref 0.0–149.0)
VLDL: 15.4 mg/dL (ref 0.0–40.0)

## 2016-05-19 LAB — HEMOGLOBIN A1C: Hgb A1c MFr Bld: 7.1 % — ABNORMAL HIGH (ref 4.6–6.5)

## 2016-05-19 LAB — COMPREHENSIVE METABOLIC PANEL
ALBUMIN: 4.5 g/dL (ref 3.5–5.2)
ALT: 14 U/L (ref 0–35)
AST: 13 U/L (ref 0–37)
Alkaline Phosphatase: 61 U/L (ref 39–117)
BUN: 21 mg/dL (ref 6–23)
CHLORIDE: 103 meq/L (ref 96–112)
CO2: 31 meq/L (ref 19–32)
Calcium: 10 mg/dL (ref 8.4–10.5)
Creatinine, Ser: 1.24 mg/dL — ABNORMAL HIGH (ref 0.40–1.20)
GFR: 46.64 mL/min — ABNORMAL LOW (ref >60.00)
Glucose, Bld: 154 mg/dL — ABNORMAL HIGH (ref 70–99)
POTASSIUM: 3.5 meq/L (ref 3.5–5.1)
SODIUM: 139 meq/L (ref 135–145)
Total Bilirubin: 1 mg/dL (ref 0.2–1.2)
Total Protein: 7.8 g/dL (ref 6.0–8.3)

## 2016-05-28 ENCOUNTER — Ambulatory Visit (INDEPENDENT_AMBULATORY_CARE_PROVIDER_SITE_OTHER): Payer: BLUE CROSS/BLUE SHIELD | Admitting: Family Medicine

## 2016-05-28 ENCOUNTER — Encounter: Payer: Self-pay | Admitting: Family Medicine

## 2016-05-28 DIAGNOSIS — E785 Hyperlipidemia, unspecified: Secondary | ICD-10-CM | POA: Diagnosis not present

## 2016-05-28 DIAGNOSIS — I10 Essential (primary) hypertension: Secondary | ICD-10-CM

## 2016-05-28 DIAGNOSIS — N183 Chronic kidney disease, stage 3 unspecified: Secondary | ICD-10-CM

## 2016-05-28 DIAGNOSIS — E119 Type 2 diabetes mellitus without complications: Secondary | ICD-10-CM | POA: Diagnosis not present

## 2016-05-28 LAB — HM DIABETES FOOT EXAM

## 2016-05-28 NOTE — Progress Notes (Signed)
Pre visit review using our clinic review tool, if applicable. No additional management support is needed unless otherwise documented below in the visit note. 

## 2016-05-28 NOTE — Assessment & Plan Note (Signed)
Stable control. 

## 2016-05-28 NOTE — Progress Notes (Signed)
Subjective:    Patient ID: Dana Rogers, female    DOB: 05/18/1955, 61 y.o.   MRN: [id number]  HPI   61 year old female presents for DM follow up.   Diabetes:   Much better control on  Higher dose metformin as well as glipizide. Lab Results  Component Value Date   HGBA1C 7.1 (H) 05/19/2016  Using medications without difficulties: Hypoglycemic episodes:? Hyperglycemic episodes:? Feet problems: none Blood Sugars averaging: not checking blood sugar eye exam within last year: due  Hypertension:  Good control on current regi,men: losartan HCTZ, amlodipine. BP Readings from Last 3 Encounters:  05/28/16 134/80  02/24/16 130/90  11/20/15 126/86  Using medication without problems or lightheadedness: none Chest pain with exertion: none Edema:None Short of breath: none Average home BPs: not checking. Other issues:  Elevated Cholesterol:  LDL much improved no on lipitor 20 mg daily. Lab Results  Component Value Date   CHOL 119 05/19/2016   HDL 37.60 (L) 05/19/2016   LDLCALC 66 05/19/2016   TRIG 77.0 05/19/2016   CHOLHDL 3 05/19/2016  Using medications without problems: none Muscle aches:  None Diet compliance: good Exercise: none Other complaints:   CKD stable.  Review of Systems  Constitutional: Negative for fatigue and fever.  HENT: Negative for ear pain.   Eyes: Negative for pain.  Respiratory: Negative for chest tightness and shortness of breath.   Cardiovascular: Negative for chest pain, palpitations and leg swelling.  Gastrointestinal: Negative for abdominal pain.  Genitourinary: Negative for dysuria.       Objective:   Physical Exam  Constitutional: Vital signs are normal. She appears well-developed and well-nourished. She is cooperative.  Non-toxic appearance. She does not appear ill. No distress.  HENT:  Head: Normocephalic.  Right Ear: Hearing, tympanic membrane, external ear and ear canal normal. Tympanic membrane is not erythematous, not retracted  and not bulging.  Left Ear: Hearing, tympanic membrane, external ear and ear canal normal. Tympanic membrane is not erythematous, not retracted and not bulging.  Nose: No mucosal edema or rhinorrhea. Right sinus exhibits no maxillary sinus tenderness and no frontal sinus tenderness. Left sinus exhibits no maxillary sinus tenderness and no frontal sinus tenderness.  Mouth/Throat: Uvula is midline, oropharynx is clear and moist and mucous membranes are normal.  Eyes: Conjunctivae, EOM and lids are normal. Pupils are equal, round, and reactive to light. Lids are everted and swept, no foreign bodies found.  Neck: Trachea normal and normal range of motion. Neck supple. Carotid bruit is not present. No thyroid mass and no thyromegaly present.  Cardiovascular: Normal rate, regular rhythm, S1 normal, S2 normal, normal heart sounds, intact distal pulses and normal pulses.  Exam reveals no gallop and no friction rub.   No murmur heard. Pulmonary/Chest: Effort normal and breath sounds normal. No tachypnea. No respiratory distress. She has no decreased breath sounds. She has no wheezes. She has no rhonchi. She has no rales.  Abdominal: Soft. Normal appearance and bowel sounds are normal. There is no tenderness.  Neurological: She is alert.  Skin: Skin is warm, dry and intact. No rash noted.  Psychiatric: Her speech is normal and behavior is normal. Judgment and thought content normal. Her mood appears not anxious. Cognition and memory are normal. She does not exhibit a depressed mood.    Diabetic foot exam: Normal inspection No skin breakdown No calluses  Normal DP pulses Normal sensation to light touch and monofilament Nails normal       Assessment & Plan:

## 2016-05-28 NOTE — Assessment & Plan Note (Signed)
Well controlled. Continue current medication.  

## 2016-05-28 NOTE — Assessment & Plan Note (Signed)
Almost at goal on higher dose metformin. GFR 47

## 2016-05-28 NOTE — Patient Instructions (Addendum)
Keep up great work on low carb low chol diet.  Don't forget yearly eye exam.  Work on starting regular exercise 3-5 times a week.

## 2016-05-28 NOTE — Assessment & Plan Note (Signed)
LDL now at goal on statin moderate dose. Pt tolerating well. Encouraged exercise, weight loss, healthy eating habits.

## 2016-08-22 ENCOUNTER — Other Ambulatory Visit: Payer: Self-pay | Admitting: Family Medicine

## 2016-09-24 ENCOUNTER — Other Ambulatory Visit: Payer: Self-pay | Admitting: Family Medicine

## 2016-10-31 ENCOUNTER — Other Ambulatory Visit: Payer: Self-pay | Admitting: Family Medicine

## 2016-12-13 ENCOUNTER — Other Ambulatory Visit: Payer: BLUE CROSS/BLUE SHIELD

## 2016-12-13 ENCOUNTER — Telehealth: Payer: Self-pay | Admitting: Family Medicine

## 2016-12-13 DIAGNOSIS — E785 Hyperlipidemia, unspecified: Secondary | ICD-10-CM

## 2016-12-13 DIAGNOSIS — E119 Type 2 diabetes mellitus without complications: Secondary | ICD-10-CM

## 2016-12-13 NOTE — Telephone Encounter (Signed)
-----   Message from Natasha C Chavers sent at 12/02/2016 11:53 AM EDT ----- Regarding: Cpx labs Mon 8/20, need orders. Thanks :-) Please order  future cpx labs for pt's upcoming lab appt. Thanks Tasha  

## 2016-12-16 ENCOUNTER — Encounter: Payer: Managed Care, Other (non HMO) | Admitting: Family Medicine

## 2017-01-04 ENCOUNTER — Other Ambulatory Visit (INDEPENDENT_AMBULATORY_CARE_PROVIDER_SITE_OTHER): Payer: BLUE CROSS/BLUE SHIELD

## 2017-01-04 DIAGNOSIS — E119 Type 2 diabetes mellitus without complications: Secondary | ICD-10-CM

## 2017-01-04 DIAGNOSIS — E785 Hyperlipidemia, unspecified: Secondary | ICD-10-CM

## 2017-01-04 LAB — COMPREHENSIVE METABOLIC PANEL
ALBUMIN: 4.5 g/dL (ref 3.5–5.2)
ALK PHOS: 60 U/L (ref 39–117)
ALT: 15 U/L (ref 0–35)
AST: 17 U/L (ref 0–37)
BUN: 25 mg/dL — AB (ref 6–23)
CO2: 30 mEq/L (ref 19–32)
CREATININE: 1.28 mg/dL — AB (ref 0.40–1.20)
Calcium: 10.3 mg/dL (ref 8.4–10.5)
Chloride: 101 mEq/L (ref 96–112)
GFR: 44.87 mL/min — ABNORMAL LOW (ref >60.00)
Glucose, Bld: 143 mg/dL — ABNORMAL HIGH (ref 70–99)
Potassium: 3.4 mEq/L — ABNORMAL LOW (ref 3.5–5.1)
SODIUM: 139 meq/L (ref 135–145)
TOTAL PROTEIN: 7.9 g/dL (ref 6.0–8.3)
Total Bilirubin: 1.2 mg/dL (ref 0.2–1.2)

## 2017-01-04 LAB — LIPID PANEL
CHOL/HDL RATIO: 4
Cholesterol: 133 mg/dL (ref 0–200)
HDL: 33.5 mg/dL — ABNORMAL LOW (ref >39.00)
LDL Cholesterol: 82 mg/dL (ref 0–99)
NONHDL: 99.44
Triglycerides: 89 mg/dL (ref 0.0–149.0)
VLDL: 17.8 mg/dL (ref 0.0–40.0)

## 2017-01-04 LAB — HEMOGLOBIN A1C: HEMOGLOBIN A1C: 7.8 % — AB (ref 4.6–6.5)

## 2017-01-11 ENCOUNTER — Other Ambulatory Visit (HOSPITAL_COMMUNITY)
Admission: RE | Admit: 2017-01-11 | Discharge: 2017-01-11 | Disposition: A | Discharge status: Home / Self Care | Payer: BLUE CROSS/BLUE SHIELD | Source: Ambulatory Visit | Attending: Family Medicine | Admitting: Family Medicine

## 2017-01-11 ENCOUNTER — Ambulatory Visit (INDEPENDENT_AMBULATORY_CARE_PROVIDER_SITE_OTHER): Payer: BLUE CROSS/BLUE SHIELD | Admitting: Family Medicine

## 2017-01-11 ENCOUNTER — Encounter: Payer: Self-pay | Admitting: Family Medicine

## 2017-01-11 VITALS — BP 120/72 | HR 62 | Temp 98.6°F | Ht 65.5 in | Wt 165.0 lb

## 2017-01-11 DIAGNOSIS — R87810 Cervical high risk human papillomavirus (HPV) DNA test positive: Secondary | ICD-10-CM | POA: Diagnosis not present

## 2017-01-11 DIAGNOSIS — Z1211 Encounter for screening for malignant neoplasm of colon: Secondary | ICD-10-CM

## 2017-01-11 DIAGNOSIS — Z124 Encounter for screening for malignant neoplasm of cervix: Secondary | ICD-10-CM | POA: Insufficient documentation

## 2017-01-11 DIAGNOSIS — N183 Chronic kidney disease, stage 3 unspecified: Secondary | ICD-10-CM

## 2017-01-11 DIAGNOSIS — Z0000 Encounter for general adult medical examination without abnormal findings: Secondary | ICD-10-CM | POA: Diagnosis not present

## 2017-01-11 DIAGNOSIS — E119 Type 2 diabetes mellitus without complications: Secondary | ICD-10-CM | POA: Diagnosis not present

## 2017-01-11 DIAGNOSIS — E785 Hyperlipidemia, unspecified: Secondary | ICD-10-CM | POA: Diagnosis not present

## 2017-01-11 DIAGNOSIS — I10 Essential (primary) hypertension: Secondary | ICD-10-CM | POA: Diagnosis not present

## 2017-01-11 LAB — HM DIABETES FOOT EXAM

## 2017-01-11 MED ORDER — GLUCOSE BLOOD VI STRP: ORAL_STRIP | 3 refills | Status: DC

## 2017-01-11 MED ORDER — ONETOUCH ULTRA 2 W/DEVICE KIT: PACK | 0 refills | Status: DC

## 2017-01-11 MED ORDER — ONETOUCH DELICA LANCETS FINE MISC: 3 refills | Status: DC

## 2017-01-11 NOTE — Assessment & Plan Note (Signed)
No longer as well controlled.. Forgets second tab of metformin. Encouraged her to take daily.  GFR remains adequate.

## 2017-01-11 NOTE — Addendum Note (Signed)
Addended by: Damita Lack on: 01/11/2017 10:20 AM   Modules accepted: Orders

## 2017-01-11 NOTE — Assessment & Plan Note (Signed)
Neg colposcopy in 2016.Marland Kitchen Repeat pap yearly to assure no cervical changes.

## 2017-01-11 NOTE — Assessment & Plan Note (Signed)
Stable control.  Avid NSAID, keep up with water.

## 2017-01-11 NOTE — Assessment & Plan Note (Signed)
Well controlled. Continue current medication.  

## 2017-01-11 NOTE — Assessment & Plan Note (Signed)
Well controlled on statin. Continue current medication.

## 2017-01-11 NOTE — Progress Notes (Addendum)
Subjective:    Patient ID: Dana Rogers, female    DOB: 1955/12/05, 61 y.o.   MRN: [id number]  HPI  61 year old female presents for wellness  Diabetes:  Inadequate control on metformin  daily, glipizide  Max She forgets to take the second metformin. Lab Results  Component Value Date   HGBA1C 7.8 (H) 01/04/2017  Using medications without difficulties: none Hypoglycemic episodes: Hyperglycemic episodes: Feet problems:no ulcer Blood Sugars averaging: not checking eye exam within last year: due  Hypertension:   Good control on amlodipine and losartan HCTZ. BP Readings from Last 3 Encounters:  01/11/17 120/72  05/28/16 134/80  02/24/16 130/90  Using medication without problems or lightheadedness:  none Chest pain with exertion: none Edema: none Short of breath: none Average home BPs: good control. Other issues:  Elevated Cholesterol:  Good control on  lipitor 20 mg daily Lab Results  Component Value Date   CHOL 133 01/04/2017   HDL 33.50 (L) 01/04/2017   LDLCALC 82 01/04/2017   TRIG 89.0 01/04/2017   CHOLHDL 4 01/04/2017  Using medications without problems: none Muscle aches: none Diet compliance: moderate Exercise: none Other complaints:  CKD: stable at baseline Cr 1.28   Social History /Family History/Past Medical History reviewed in detail and updated in EMR if needed. Blood pressure 120/72, pulse 62, temperature 98.6 F (37 C), temperature source Oral, height 5' 5.5" (1.664 m), weight 165 lb (74.8 kg).  Review of Systems  Constitutional: Negative for fatigue and fever.  HENT: Negative for congestion.   Eyes: Negative for pain.  Respiratory: Negative for cough and shortness of breath.   Cardiovascular: Negative for chest pain, palpitations and leg swelling.  Gastrointestinal: Negative for abdominal pain.  Genitourinary: Negative for dysuria and vaginal bleeding.  Musculoskeletal: Negative for back pain.  Neurological: Negative for syncope,  light-headedness and headaches.  Psychiatric/Behavioral: Negative for dysphoric mood.       Objective:   Physical Exam  Constitutional: Vital signs are normal. She appears well-developed and well-nourished. She is cooperative.  Non-toxic appearance. She does not appear ill. No distress.  HENT:  Head: Normocephalic.  Right Ear: Hearing, tympanic membrane, external ear and ear canal normal.  Left Ear: Hearing, tympanic membrane, external ear and ear canal normal.  Nose: Nose normal.  Eyes: Pupils are equal, round, and reactive to light. Conjunctivae, EOM and lids are normal. Lids are everted and swept, no foreign bodies found.  Neck: Trachea normal and normal range of motion. Neck supple. Carotid bruit is not present. No thyroid mass and no thyromegaly present.  Cardiovascular: Normal rate, regular rhythm, S1 normal, S2 normal, normal heart sounds and intact distal pulses.  Exam reveals no gallop.   No murmur heard. Pulmonary/Chest: Effort normal and breath sounds normal. No respiratory distress. She has no wheezes. She has no rhonchi. She has no rales.  Abdominal: Soft. Normal appearance and bowel sounds are normal. She exhibits no distension, no fluid wave, no abdominal bruit and no mass. There is no hepatosplenomegaly. There is no tenderness. There is no rebound, no guarding and no CVA tenderness. No hernia.  Genitourinary: Vagina normal and uterus normal. No breast swelling, tenderness, discharge or bleeding. Pelvic exam was performed with patient supine. There is no rash, tenderness or lesion on the right labia. There is no rash, tenderness or lesion on the left labia. Uterus is not enlarged and not tender. Cervix exhibits no motion tenderness, no discharge and no friability. Right adnexum displays no mass, no tenderness and  no fullness. Left adnexum displays no mass, no tenderness and no fullness.  Lymphadenopathy:    She has no cervical adenopathy.    She has no axillary adenopathy.    Neurological: She is alert. She has normal strength. No cranial nerve deficit or sensory deficit.  Skin: Skin is warm, dry and intact. No rash noted.  Psychiatric: Her speech is normal and behavior is normal. Judgment normal. Her mood appears not anxious. Cognition and memory are normal. She does not exhibit a depressed mood.     Diabetic foot exam: Normal inspection No skin breakdown No calluses  Normal DP pulses Normal sensation to light touch and monofilament Nails normal      Assessment & Plan:  The patient's preventative maintenance and recommended screening tests for an annual wellness exam were reviewed in full today. Brought up to date unless services declined.  Counselled on the importance of diet, exercise, and its role in overall health and mortality. The patient's FH and SH was reviewed, including their home life, tobacco status, and drug and alcohol status.   Last PAP nml 2010, 2012..high risk HPV 05/2012, high risk HPV 08/2013,  And again in 2016 .Marland Kitchen Referred to Dr. Jennette Kettle GYN, neg colposcopy.. Plan yearly pap. Mammo 2010.. Due.  Colon cancer screening: Could not tolerate golytely in past, unable to do colonoscopy 2010. Plan yearly ifob. Vaccines up to date with PNA, Tdap, given flu .  Due for shingles. DEXA: no early osteo in family.. Plan start 64.  Hep C screening done  HIV: done

## 2017-01-11 NOTE — Patient Instructions (Addendum)
Work on increasing regular exercise as able. Can take metformin 2 tabs at once in AM to help remember.  Due for yearly eye exam.   Please stop at the lab to pick up stool test.

## 2017-01-12 ENCOUNTER — Encounter: Payer: Self-pay | Admitting: *Deleted

## 2017-01-12 LAB — CYTOLOGY - PAP
DIAGNOSIS: NEGATIVE
HPV (WINDOPATH): NOT DETECTED

## 2017-03-02 ENCOUNTER — Other Ambulatory Visit: Payer: Self-pay | Admitting: Family Medicine

## 2017-05-08 ENCOUNTER — Other Ambulatory Visit: Payer: Self-pay | Admitting: Family Medicine

## 2017-06-05 ENCOUNTER — Other Ambulatory Visit: Payer: Self-pay | Admitting: Family Medicine

## 2017-08-23 ENCOUNTER — Ambulatory Visit: Payer: Self-pay

## 2017-08-23 MED ORDER — AMLODIPINE BESYLATE 5 MG PO TABS: 10.0000 mg | ORAL_TABLET | Freq: Every day | ORAL | 1 refills | Status: DC

## 2017-08-23 NOTE — Telephone Encounter (Signed)
Dr Ermalene Searing said for pt to increase Amlodipine to 10 mg daily;go to ED if any neuro symptoms; pt changed appt to see Dr Ermalene Searing on 08/25/17 at 9:30. Pt voiced understanding. FYI to Dr Ermalene Searing.

## 2017-08-23 NOTE — Telephone Encounter (Signed)
Patient called in with c/o "high blood pressure." She says "I noticed it Saturday night, my BP was 186/101. Yesterday morning it was 181/103, last night 187/103. I'm at work now and I took it a few minutes before I called you and it was 206/113. I can check it now, it is 206/118." I asked is she having CP, dizziness, headache, blurred vision, weakness, difficulty breathing, she says "no. Last week I had a sharp pain to the back of my head, but it went away. Other than that, no symptoms." I asked is she taking her medications as prescribed, she says "yes, I haven't missed a dosage." According to protocol, see PCP within 24 hours, patient says "I prefer to go to the office." I advised she should be seen today and there is no availability in the office or with her PCP, she says "I can just wait until tomorrow." Appointment scheduled for tomorrow at 1115 with Dr. Alphonsus Sias, care advice given, patient verbalized understanding.   Reason for Disposition . Systolic BP  >= 180 OR Diastolic >= 110  Answer Assessment - Initial Assessment Questions 1. BLOOD PRESSURE: "What is the blood pressure?" "Did you take at least two measurements 5 minutes apart?"     206/113; 206/118 2. ONSET: "When did you take your blood pressure?"     Few minutes ago and now 3. HOW: "How did you obtain the blood pressure?" (e.g., visiting nurse, automatic home BP monitor)     Automatic home BP monitor 4. HISTORY: "Do you have a history of high blood pressure?"     Yes 5. MEDICATIONS: "Are you taking any medications for blood pressure?" "Have you missed any doses recently?"     Yes, not missed any doses 6. OTHER SYMPTOMS: "Do you have any symptoms?" (e.g., headache, chest pain, blurred vision, difficulty breathing, weakness)     No 7. PREGNANCY: "Is there any chance you are pregnant?" "When was your last menstrual period?"     No  Protocols used: HIGH BLOOD PRESSURE-A-AH

## 2017-08-24 ENCOUNTER — Ambulatory Visit: Payer: BLUE CROSS/BLUE SHIELD | Admitting: Internal Medicine

## 2017-08-25 ENCOUNTER — Encounter: Payer: Self-pay | Admitting: Family Medicine

## 2017-08-25 ENCOUNTER — Ambulatory Visit (INDEPENDENT_AMBULATORY_CARE_PROVIDER_SITE_OTHER): Payer: BLUE CROSS/BLUE SHIELD | Admitting: Family Medicine

## 2017-08-25 ENCOUNTER — Other Ambulatory Visit: Payer: Self-pay

## 2017-08-25 DIAGNOSIS — I10 Essential (primary) hypertension: Secondary | ICD-10-CM | POA: Diagnosis not present

## 2017-08-25 MED ORDER — AMLODIPINE BESYLATE 5 MG PO TABS: 5.0000 mg | ORAL_TABLET | Freq: Every day | ORAL | 3 refills | Status: DC

## 2017-08-25 NOTE — Assessment & Plan Note (Signed)
Restart  Amlodipine  At 5 mg daily in addition to other meds. Close follow up to make sure at goal in 2 week.  No red flags today.

## 2017-08-25 NOTE — Progress Notes (Signed)
   Subjective:    Patient ID: Dana Rogers, female    DOB: Apr 17, 1956, 62 y.o.   MRN: [id number]  HPI  62 year old female with DM and HTN presents for recent increase in BP measurements at home.  She had noted increase numbers in last 4-5 days.. 212/98. No new meds.  No increase salt.   When reviewing med list in office.. She realized she has not been taking the amlodipine. Not sure why.. No SE.  No chest pain, no SOB.Marland Kitchen Eyes feeling heavy, no HA.  She is over due for DM follow up. BP Readings from Last 3 Encounters:  08/25/17 (!) 158/82  01/11/17 120/72  05/28/16 134/80    Body mass index is 27.7 kg/m.  Wt Readings from Last 3 Encounters:  08/25/17 169 lb (76.7 kg)  01/11/17 165 lb (74.8 kg)  05/28/16 167 lb (75.8 kg)    Social History /Family History/Past Medical History reviewed in detail and updated in EMR if needed.   Review of Systems  Constitutional: Negative for fatigue and fever.  HENT: Negative for congestion.   Eyes: Negative for pain.  Respiratory: Negative for cough and shortness of breath.   Cardiovascular: Negative for chest pain, palpitations and leg swelling.  Gastrointestinal: Negative for abdominal pain.  Genitourinary: Negative for dysuria and vaginal bleeding.  Musculoskeletal: Negative for back pain.  Neurological: Negative for syncope, light-headedness and headaches.  Psychiatric/Behavioral: Negative for dysphoric mood.       Objective:   Physical Exam  Constitutional: Vital signs are normal. She appears well-developed and well-nourished. She is cooperative.  Non-toxic appearance. She does not appear ill. No distress.  HENT:  Head: Normocephalic.  Right Ear: Hearing, tympanic membrane, external ear and ear canal normal. Tympanic membrane is not erythematous, not retracted and not bulging.  Left Ear: Hearing, tympanic membrane, external ear and ear canal normal. Tympanic membrane is not erythematous, not retracted and not bulging.  Nose: No  mucosal edema or rhinorrhea. Right sinus exhibits no maxillary sinus tenderness and no frontal sinus tenderness. Left sinus exhibits no maxillary sinus tenderness and no frontal sinus tenderness.  Mouth/Throat: Uvula is midline, oropharynx is clear and moist and mucous membranes are normal.  Eyes: Pupils are equal, round, and reactive to light. Conjunctivae, EOM and lids are normal. Lids are everted and swept, no foreign bodies found.  Neck: Trachea normal and normal range of motion. Neck supple. Carotid bruit is not present. No thyroid mass and no thyromegaly present.  Cardiovascular: Normal rate, regular rhythm, S1 normal, S2 normal, normal heart sounds, intact distal pulses and normal pulses. Exam reveals no gallop and no friction rub.  No murmur heard. Pulmonary/Chest: Effort normal and breath sounds normal. No tachypnea. No respiratory distress. She has no decreased breath sounds. She has no wheezes. She has no rhonchi. She has no rales.  Abdominal: Soft. Normal appearance and bowel sounds are normal. There is no tenderness.  Neurological: She is alert.  Skin: Skin is warm, dry and intact. No rash noted.  Psychiatric: Her speech is normal and behavior is normal. Judgment and thought content normal. Her mood appears not anxious. Cognition and memory are normal. She does not exhibit a depressed mood.          Assessment & Plan:

## 2017-08-25 NOTE — Patient Instructions (Signed)
Restart amlodipine 5 mg daily.  Follow BP at home.. Goal < 140/90.  Call if remaining elevated.

## 2017-09-02 ENCOUNTER — Other Ambulatory Visit (INDEPENDENT_AMBULATORY_CARE_PROVIDER_SITE_OTHER): Payer: BLUE CROSS/BLUE SHIELD

## 2017-09-02 ENCOUNTER — Telehealth: Payer: Self-pay | Admitting: Family Medicine

## 2017-09-02 DIAGNOSIS — E119 Type 2 diabetes mellitus without complications: Secondary | ICD-10-CM | POA: Diagnosis not present

## 2017-09-02 LAB — LIPID PANEL
CHOL/HDL RATIO: 3
Cholesterol: 111 mg/dL (ref 0–200)
HDL: 34.7 mg/dL — AB (ref >39.00)
LDL Cholesterol: 66 mg/dL (ref 0–99)
NONHDL: 76.48
Triglycerides: 50 mg/dL (ref 0.0–149.0)
VLDL: 10 mg/dL (ref 0.0–40.0)

## 2017-09-02 LAB — HEMOGLOBIN A1C: Hgb A1c MFr Bld: 7.6 % — ABNORMAL HIGH (ref 4.6–6.5)

## 2017-09-02 LAB — COMPREHENSIVE METABOLIC PANEL
ALT: 13 U/L (ref 0–35)
AST: 14 U/L (ref 0–37)
Albumin: 4.4 g/dL (ref 3.5–5.2)
Alkaline Phosphatase: 64 U/L (ref 39–117)
BUN: 19 mg/dL (ref 6–23)
CO2: 32 meq/L (ref 19–32)
Calcium: 9.9 mg/dL (ref 8.4–10.5)
Chloride: 101 mEq/L (ref 96–112)
Creatinine, Ser: 1.15 mg/dL (ref 0.40–1.20)
GFR: 50.67 mL/min — AB (ref >60.00)
GLUCOSE: 156 mg/dL — AB (ref 70–99)
POTASSIUM: 3.4 meq/L — AB (ref 3.5–5.1)
Sodium: 138 mEq/L (ref 135–145)
Total Bilirubin: 1.1 mg/dL (ref 0.2–1.2)
Total Protein: 7.9 g/dL (ref 6.0–8.3)

## 2017-09-02 NOTE — Telephone Encounter (Signed)
-----   Message from Alvina Chou sent at 08/26/2017 10:56 AM EDT ----- Regarding: Lab orders for Friday, 5.10.19 Lab orders for a 2 week f/u appt

## 2017-09-06 ENCOUNTER — Ambulatory Visit (INDEPENDENT_AMBULATORY_CARE_PROVIDER_SITE_OTHER): Payer: 59 | Admitting: Family Medicine

## 2017-09-06 ENCOUNTER — Other Ambulatory Visit: Payer: Self-pay

## 2017-09-06 ENCOUNTER — Encounter: Payer: Self-pay | Admitting: Family Medicine

## 2017-09-06 VITALS — BP 132/75 | HR 67 | Temp 98.4°F | Ht 65.5 in | Wt 167.5 lb

## 2017-09-06 DIAGNOSIS — I10 Essential (primary) hypertension: Secondary | ICD-10-CM | POA: Diagnosis not present

## 2017-09-06 DIAGNOSIS — E119 Type 2 diabetes mellitus without complications: Secondary | ICD-10-CM

## 2017-09-06 DIAGNOSIS — E785 Hyperlipidemia, unspecified: Secondary | ICD-10-CM | POA: Diagnosis not present

## 2017-09-06 LAB — HM DIABETES FOOT EXAM

## 2017-09-06 NOTE — Assessment & Plan Note (Signed)
Improving control. Pt will take both tabs of metformin at same time to improve compliance. Increase exercise and decrease high glycemic index fruits.  MAy need dose adjustment of metformin if not at goal next OV.

## 2017-09-06 NOTE — Assessment & Plan Note (Signed)
Well controlled. Continue current medication. Encouraged exercise, weight loss, healthy eating habits.  

## 2017-09-06 NOTE — Progress Notes (Signed)
Subjective:    Patient ID: Dana Rogers, female    DOB: 02-Sep-1955, 62 y.o.   MRN: [id number]  Diabetes  Pertinent negatives for hypoglycemia include no headaches. Pertinent negatives for diabetes include no chest pain and no fatigue.       62 year old female presents for follow up DM.  ,Diabetes:   Inadequate control  But improved from last check on glipizide max and metformin 500 mg BID. She continue to forget second dose Lab Results  Component Value Date   HGBA1C 7.6 (H) 09/02/2017  Using medications without difficulties: Hypoglycemic episodes: Hyperglycemic episodes: Feet problems: none Blood Sugars averaging: not checking eye exam within last year: overdue, reminded of necessity  Hypertension:     Good control on amlodipine, losartan HCTZ BP Readings from Last 3 Encounters:  09/06/17 132/75  08/25/17 (!) 158/82  01/11/17 120/72  Using medication without problems or lightheadedness:  none Chest pain with exertion: none Edema:none Short of breath: none Average home BPs: well controlled. Other issues:   Elevated Cholesterol:  At goal on atorvastatin. Lab Results  Component Value Date   CHOL 111 09/02/2017   HDL 34.70 (L) 09/02/2017   LDLCALC 66 09/02/2017   TRIG 50.0 09/02/2017   CHOLHDL 3 09/02/2017  Using medications without problems: Muscle aches:  Diet compliance: good, but eats lots of fruit. Exercise: none Other complaints:     Review of Systems  Constitutional: Negative for fatigue and fever.  HENT: Negative for congestion.   Eyes: Negative for pain.  Respiratory: Negative for cough and shortness of breath.   Cardiovascular: Negative for chest pain, palpitations and leg swelling.  Gastrointestinal: Negative for abdominal pain.  Genitourinary: Negative for dysuria and vaginal bleeding.  Musculoskeletal: Negative for back pain.  Neurological: Negative for syncope, light-headedness and headaches.  Psychiatric/Behavioral: Negative for dysphoric  mood.       Objective:   Physical Exam  Constitutional: Vital signs are normal. She appears well-developed and well-nourished. She is cooperative.  Non-toxic appearance. She does not appear ill. No distress.  HENT:  Head: Normocephalic.  Right Ear: Hearing, tympanic membrane, external ear and ear canal normal. Tympanic membrane is not erythematous, not retracted and not bulging.  Left Ear: Hearing, tympanic membrane, external ear and ear canal normal. Tympanic membrane is not erythematous, not retracted and not bulging.  Nose: No mucosal edema or rhinorrhea. Right sinus exhibits no maxillary sinus tenderness and no frontal sinus tenderness. Left sinus exhibits no maxillary sinus tenderness and no frontal sinus tenderness.  Mouth/Throat: Uvula is midline, oropharynx is clear and moist and mucous membranes are normal.  Eyes: Pupils are equal, round, and reactive to light. Conjunctivae, EOM and lids are normal. Lids are everted and swept, no foreign bodies found.  Neck: Trachea normal and normal range of motion. Neck supple. Carotid bruit is not present. No thyroid mass and no thyromegaly present.  Cardiovascular: Normal rate, regular rhythm, S1 normal, S2 normal, normal heart sounds, intact distal pulses and normal pulses. Exam reveals no gallop and no friction rub.  No murmur heard. Pulmonary/Chest: Effort normal and breath sounds normal. No tachypnea. No respiratory distress. She has no decreased breath sounds. She has no wheezes. She has no rhonchi. She has no rales.  Abdominal: Soft. Normal appearance and bowel sounds are normal. There is no tenderness.  Neurological: She is alert.  Skin: Skin is warm, dry and intact. No rash noted.  Psychiatric: Her speech is normal and behavior is normal. Judgment and thought content  normal. Her mood appears not anxious. Cognition and memory are normal. She does not exhibit a depressed mood.      Diabetic foot exam: Normal inspection No skin  breakdown No calluses  Normal DP pulses Normal sensation to light touch and monofilament Nails normal     Assessment & Plan:

## 2017-09-06 NOTE — Patient Instructions (Addendum)
Make sure to take metformin each day 2 tablets.  Working on low [employer], and  Exercise.  Check fasting blood sugar goal < 120, 2  Hr after meals <180.

## 2017-09-06 NOTE — Assessment & Plan Note (Signed)
Well controlled. Continue current medication.  

## 2017-09-07 ENCOUNTER — Other Ambulatory Visit: Payer: Self-pay | Admitting: Family Medicine

## 2017-10-05 ENCOUNTER — Other Ambulatory Visit: Payer: Self-pay | Admitting: Family Medicine

## 2017-12-09 ENCOUNTER — Telehealth: Payer: Self-pay | Admitting: Family Medicine

## 2017-12-09 ENCOUNTER — Other Ambulatory Visit (INDEPENDENT_AMBULATORY_CARE_PROVIDER_SITE_OTHER): Payer: 59

## 2017-12-09 DIAGNOSIS — E119 Type 2 diabetes mellitus without complications: Secondary | ICD-10-CM

## 2017-12-09 LAB — HEMOGLOBIN A1C: HEMOGLOBIN A1C: 7.4 % — AB (ref 4.6–6.5)

## 2017-12-09 NOTE — Telephone Encounter (Signed)
-----   Message from Alvina Chouerri J Walsh sent at 11/29/2017  9:29 AM EDT ----- Regarding: Lab orders for Friday, 5.16.19 Lab orders for a 3 month follow up appt.

## 2017-12-13 ENCOUNTER — Ambulatory Visit (INDEPENDENT_AMBULATORY_CARE_PROVIDER_SITE_OTHER): Payer: 59 | Admitting: Family Medicine

## 2017-12-13 ENCOUNTER — Encounter: Payer: Self-pay | Admitting: Family Medicine

## 2017-12-13 VITALS — BP 130/80 | HR 56 | Temp 98.3°F | Ht 65.5 in | Wt 167.5 lb

## 2017-12-13 DIAGNOSIS — E119 Type 2 diabetes mellitus without complications: Secondary | ICD-10-CM | POA: Diagnosis not present

## 2017-12-13 LAB — HM DIABETES FOOT EXAM

## 2017-12-13 NOTE — Progress Notes (Signed)
Subjective:    Patient ID: Dana Biblevelyn E Rogers, female    DOB: [birth year], 62 y.o.   MRN: [id number]  HPI   62 year old female presents for 3 month follow up.  Diabetes:   Continued improvement of A1C.Marland Kitchen. Almost at goal < 7 Now taking 2 tabs of metformin more regularly.  No SE on higher dose. Lab Results  Component Value Date   HGBA1C 7.4 (H) 12/09/2017  Using medications without difficulties: Hypoglycemic episodes: Hyperglycemic episodes: Feet problems: no ulcers Blood Sugars averaging: not checking. Eye exam within last year: overdue  Has been more fatigued in last week. No CP, no SOB.    Wt Readings from Last 3 Encounters:  12/13/17 167 lb 8 oz (76 kg)  09/06/17 167 lb 8 oz (76 kg)  08/25/17 169 lb (76.7 kg)    Blood pressure 130/80, pulse (!) 56, temperature 98.3 F (36.8 C), temperature source Oral, height 5' 5.5" (1.664 m), weight 167 lb 8 oz (76 kg). Social History /Family History/Past Medical History reviewed in detail and updated in EMR if needed.  Review of Systems  Constitutional: Positive for fatigue. Negative for fever.  HENT: Negative for congestion.   Eyes: Negative for pain.  Respiratory: Negative for cough and shortness of breath.   Cardiovascular: Negative for chest pain, palpitations and leg swelling.  Gastrointestinal: Negative for abdominal pain.  Genitourinary: Negative for dysuria and vaginal bleeding.  Musculoskeletal: Negative for back pain.  Neurological: Negative for syncope, light-headedness and headaches.  Psychiatric/Behavioral: Negative for dysphoric mood.       Objective:   Physical Exam  Constitutional: Vital signs are normal. She appears well-developed and well-nourished. She is cooperative.  Non-toxic appearance. She does not appear ill. No distress.  HENT:  Head: Normocephalic.  Right Ear: Hearing, tympanic membrane, external ear and ear canal normal. Tympanic membrane is not erythematous, not retracted and not bulging.  Left Ear:  Hearing, tympanic membrane, external ear and ear canal normal. Tympanic membrane is not erythematous, not retracted and not bulging.  Nose: No mucosal edema or rhinorrhea. Right sinus exhibits no maxillary sinus tenderness and no frontal sinus tenderness. Left sinus exhibits no maxillary sinus tenderness and no frontal sinus tenderness.  Mouth/Throat: Uvula is midline, oropharynx is clear and moist and mucous membranes are normal.  Eyes: Pupils are equal, round, and reactive to light. Conjunctivae, EOM and lids are normal. Lids are everted and swept, no foreign bodies found.  Neck: Trachea normal and normal range of motion. Neck supple. Carotid bruit is not present. No thyroid mass and no thyromegaly present.  Cardiovascular: Normal rate, regular rhythm, S1 normal, S2 normal, normal heart sounds, intact distal pulses and normal pulses. Exam reveals no gallop and no friction rub.  No murmur heard. Pulmonary/Chest: Effort normal and breath sounds normal. No tachypnea. No respiratory distress. She has no decreased breath sounds. She has no wheezes. She has no rhonchi. She has no rales.  Abdominal: Soft. Normal appearance and bowel sounds are normal. There is no tenderness.  Neurological: She is alert.  Skin: Skin is warm, dry and intact. No rash noted.  Psychiatric: Her speech is normal and behavior is normal. Judgment and thought content normal. Her mood appears not anxious. Cognition and memory are normal. She does not exhibit a depressed mood.    Diabetic foot exam: Normal inspection No skin breakdown No calluses  Normal DP pulses Normal sensation to light touch and monofilament Nails normal        Assessment &  Plan:

## 2017-12-13 NOTE — Patient Instructions (Signed)
Keep up great work.. Work on low carb diet and exercise.

## 2017-12-13 NOTE — Assessment & Plan Note (Signed)
Improved control taking med more regularly.  Will continue to work on lifestyle change.. If not at goal <7 consider increased dose or additional med.

## 2018-02-25 ENCOUNTER — Other Ambulatory Visit: Payer: Self-pay

## 2018-02-25 ENCOUNTER — Encounter (HOSPITAL_COMMUNITY): Payer: Self-pay | Admitting: Emergency Medicine

## 2018-02-25 ENCOUNTER — Emergency Department (HOSPITAL_COMMUNITY): Payer: 59

## 2018-02-25 ENCOUNTER — Emergency Department (HOSPITAL_COMMUNITY)
Admission: EM | Admit: 2018-02-25 | Discharge: 2018-02-25 | Disposition: A | Discharge status: Home / Self Care | Payer: 59 | Attending: Emergency Medicine | Admitting: Emergency Medicine

## 2018-02-25 DIAGNOSIS — N189 Chronic kidney disease, unspecified: Secondary | ICD-10-CM | POA: Insufficient documentation

## 2018-02-25 DIAGNOSIS — Z79899 Other long term (current) drug therapy: Secondary | ICD-10-CM | POA: Insufficient documentation

## 2018-02-25 DIAGNOSIS — R0789 Other chest pain: Secondary | ICD-10-CM | POA: Diagnosis not present

## 2018-02-25 DIAGNOSIS — I129 Hypertensive chronic kidney disease with stage 1 through stage 4 chronic kidney disease, or unspecified chronic kidney disease: Secondary | ICD-10-CM | POA: Insufficient documentation

## 2018-02-25 DIAGNOSIS — E1122 Type 2 diabetes mellitus with diabetic chronic kidney disease: Secondary | ICD-10-CM | POA: Diagnosis not present

## 2018-02-25 DIAGNOSIS — R079 Chest pain, unspecified: Secondary | ICD-10-CM | POA: Diagnosis not present

## 2018-02-25 DIAGNOSIS — Z7984 Long term (current) use of oral hypoglycemic drugs: Secondary | ICD-10-CM | POA: Insufficient documentation

## 2018-02-25 DIAGNOSIS — R0602 Shortness of breath: Secondary | ICD-10-CM | POA: Diagnosis not present

## 2018-02-25 DIAGNOSIS — R7989 Other specified abnormal findings of blood chemistry: Secondary | ICD-10-CM | POA: Diagnosis not present

## 2018-02-25 LAB — HEPATIC FUNCTION PANEL
ALBUMIN: 4.8 g/dL (ref 3.5–5.0)
ALK PHOS: 55 U/L (ref 38–126)
ALT: 23 U/L (ref 0–44)
AST: 24 U/L (ref 15–41)
BILIRUBIN DIRECT: 0.2 mg/dL (ref 0.0–0.2)
BILIRUBIN INDIRECT: 1.3 mg/dL — AB (ref 0.3–0.9)
BILIRUBIN TOTAL: 1.5 mg/dL — AB (ref 0.3–1.2)
Total Protein: 8.5 g/dL — ABNORMAL HIGH (ref 6.5–8.1)

## 2018-02-25 LAB — BASIC METABOLIC PANEL
Anion gap: 9 (ref 5–15)
BUN: 17 mg/dL (ref 8–23)
CO2: 28 mmol/L (ref 22–32)
CREATININE: 1.16 mg/dL — AB (ref 0.44–1.00)
Calcium: 9.7 mg/dL (ref 8.9–10.3)
Chloride: 101 mmol/L (ref 98–111)
GFR calc Af Amer: 57 mL/min — ABNORMAL LOW (ref >60)
GFR, EST NON AFRICAN AMERICAN: 49 mL/min — AB (ref >60)
GLUCOSE: 159 mg/dL — AB (ref 70–99)
Potassium: 3.3 mmol/L — ABNORMAL LOW (ref 3.5–5.1)
Sodium: 138 mmol/L (ref 135–145)

## 2018-02-25 LAB — CBC
HCT: 39.8 % (ref 36.0–46.0)
Hemoglobin: 13.3 g/dL (ref 12.0–15.0)
MCH: 28.5 pg (ref 26.0–34.0)
MCHC: 33.4 g/dL (ref 30.0–36.0)
MCV: 85.4 fL (ref 80.0–100.0)
PLATELETS: 168 10*3/uL (ref 150–400)
RBC: 4.66 MIL/uL (ref 3.87–5.11)
RDW: 12.3 % (ref 11.5–15.5)
WBC: 7.4 10*3/uL (ref 4.0–10.5)
nRBC: 0 % (ref 0.0–0.2)

## 2018-02-25 LAB — LIPASE, BLOOD: LIPASE: 39 U/L (ref 11–51)

## 2018-02-25 LAB — TROPONIN I: Troponin I: <0.03 ng/mL (ref <0.03)

## 2018-02-25 LAB — GROUP A STREP BY PCR: Group A Strep by PCR: NOT DETECTED

## 2018-02-25 LAB — D-DIMER, QUANTITATIVE: D-Dimer, Quant: 1.94 ug/mL-FEU — ABNORMAL HIGH (ref 0.00–0.50)

## 2018-02-25 LAB — I-STAT TROPONIN, ED: TROPONIN I, POC: 0.01 ng/mL (ref 0.00–0.08)

## 2018-02-25 MED ORDER — IOPAMIDOL (ISOVUE-370) INJECTION 76%
INTRAVENOUS | Status: AC
  Administered 2018-02-25: 100 mL
  Filled 2018-02-25: qty 100

## 2018-02-25 MED ORDER — SODIUM CHLORIDE 0.9 % IJ SOLN
INTRAMUSCULAR | Status: AC
  Administered 2018-02-25: 17:00:00
  Filled 2018-02-25: qty 50

## 2018-02-25 NOTE — ED Triage Notes (Signed)
Pt c/o mid center, constant chest pain radiating to back since this morning. Pt with vomiting today.

## 2018-02-25 NOTE — ED Notes (Signed)
Patient transported to CT 

## 2018-02-25 NOTE — ED Notes (Signed)
Bed: WA20 Expected date:  Expected time:  Means of arrival:  Comments: Chest pain

## 2018-02-25 NOTE — ED Provider Notes (Signed)
[facility] DEPT Provider Note   CSN: [id number] Arrival date & time: 02/25/18  1346     History   Chief Complaint Chief Complaint  Patient presents with  . Chest Pain    HPI Dana Rogers is a 62 y.o. female.  HPI Patient with history of diabetes and hypertension presents with right-sided chest pain.  States it started this morning when she woke up around 8:00.  Associated with sore throat.  Patient states the pain radiates through to the right thoracic back.  Is not worse with movement or deep breathing.  Denies cough but has had minimal shortness of breath.  Also states that she had 3 episodes of vomiting this afternoon.  Vomit was clear.  No coffee-ground or grossly bloody emesis.  No diarrhea or constipation.  Patient has had chills but denies any fever.  No new lower extremity swelling or pain.  No recent extended travel or immobilization. Past Medical History:  Diagnosis Date  . Diabetes mellitus   . Hyperlipidemia   . Hypertension   . Peripheral edema   . Rosacea     Patient Active Problem List   Diagnosis Date Noted  . Pap smear of cervix shows high risk HPV present 01/11/2017  . CKD (chronic kidney disease), symptom management only 11/20/2015  . Chronic low back pain 11/20/2015  . Chronic insomnia 09/03/2013  . Herpes zoster 07/20/2012  . Vulvar lesion 06/16/2012  . HERNIATED LUMBAR DISC 04/17/2009  . ALLERGIC RHINITIS 02/20/2009  . Hyperlipidemia LDL goal <100 05/15/2007  . Type 2 diabetes mellitus without complication, without long-term current use of insulin ([place]) 11/24/2001  . HYPERTENSION, MALIGNANT 08/24/2000    Past Surgical History:  Procedure Laterality Date  . CARDIAC CATHETERIZATION  2004  . CESAREAN SECTION     X2  . CYSTECTOMY     UNDER ARM AND SCALP     OB History   None      Home Medications    Prior to Admission medications   Medication Sig Start Date End Date Taking? Authorizing Provider    amLODipine (NORVASC) 5 MG tablet Take 1 tablet (5 mg total) by mouth daily. 08/25/17   Bedsole, Amy E, MD  atorvastatin (LIPITOR) 20 MG tablet TAKE 1 TABLET (20 MG TOTAL) BY MOUTH DAILY. 03/02/17   Bedsole, Amy E, MD  Blood Glucose Monitoring Suppl (ONE TOUCH ULTRA 2) w/Device KIT Ck blood sugar twice a day and as directed. Dx E11.65 01/11/17   Jinny Sanders, MD  glipiZIDE (GLUCOTROL XL) 10 MG 24 hr tablet TAKE 1 TABLET (10 MG TOTAL) BY MOUTH DAILY. 03/02/17   Bedsole, Amy E, MD  glucose blood (ONE TOUCH ULTRA TEST) test strip Ck blood sugar twice a day and as directed.Dx E11.65 01/11/17   Jinny Sanders, MD  losartan-hydrochlorothiazide (HYZAAR) 100-25 MG tablet TAKE 1 TABLET BY MOUTH DAILY. 09/07/17   Bedsole, Amy E, MD  metFORMIN (GLUCOPHAGE-XR) 500 MG 24 hr tablet TAKE 2 TABLETS BY MOUTH EVERY DAY WITH BREAKFAST 10/05/17   Bedsole, Amy E, MD  ONETOUCH DELICA LANCETS FINE MISC Ck blood sugar twice a day and as directed. Dx E11.65 01/11/17   Jinny Sanders, MD    Family History Family History  Problem Relation Age of Onset  . Heart disease Mother   . Sleep apnea Mother   . Stroke Father   . Coronary artery disease Father   . Diabetes Sister   . Hypertension Sister   . Alcohol abuse  Brother   . Diabetes Brother   . Asthma Brother   . Hypertension Brother   . Cirrhosis Brother   . Colon cancer Neg Hx     Social History Social History   Tobacco Use  . Smoking status: Never Smoker  . Smokeless tobacco: Never Used  Substance Use Topics  . Alcohol use: No  . Drug use: No     Allergies   Patient has no known allergies.   Review of Systems Review of Systems  Constitutional: Negative for chills and fever.  HENT: Positive for sore throat. Negative for trouble swallowing.   Eyes: Negative for visual disturbance.  Respiratory: Positive for shortness of breath. Negative for cough.   Cardiovascular: Positive for chest pain.  Gastrointestinal: Positive for nausea and vomiting. Negative  for abdominal pain, constipation and diarrhea.  Genitourinary: Negative for dysuria, flank pain, frequency and hematuria.  Musculoskeletal: Positive for back pain. Negative for myalgias, neck pain and neck stiffness.  Skin: Negative for rash and wound.  Neurological: Negative for dizziness, weakness, light-headedness, numbness and headaches.  All other systems reviewed and are negative.    Physical Exam Updated Vital Signs BP (!) 161/92   Pulse 75   Temp 98.6 F (37 C) (Oral)   Resp 19   SpO2 97%   Physical Exam  Constitutional: She is oriented to person, place, and time. She appears well-developed and well-nourished. No distress.  HENT:  Head: Normocephalic and atraumatic.  Unable to visualize oropharynx  Eyes: Pupils are equal, round, and reactive to light. EOM are normal.  Neck: Normal range of motion. Neck supple.  No cervical lymphadenopathy.  No meningismus.  Cardiovascular: Normal rate and regular rhythm. Exam reveals no gallop and no friction rub.  No murmur heard. Pulmonary/Chest: Effort normal and breath sounds normal. No stridor. No respiratory distress. She has no wheezes. She has no rales. She exhibits no tenderness.  Abdominal: Soft. Bowel sounds are normal. There is no tenderness. There is no rebound and no guarding.  Musculoskeletal: Normal range of motion. She exhibits no edema or tenderness.  No midline thoracic or lumbar tenderness.  No CVA tenderness.  No lower extremity swelling, asymmetry or tenderness.  Distal pulses are 2+.  Neurological: She is alert and oriented to person, place, and time.  Moves all extremities without focal deficit.  Sensation fully intact.  Skin: Skin is warm and dry. Capillary refill takes less than 2 seconds. No rash noted. She is not diaphoretic. No erythema.  Psychiatric: She has a normal mood and affect. Her behavior is normal.  Nursing note and vitals reviewed.    ED Treatments / Results  Labs (all labs ordered are listed,  but only abnormal results are displayed) Labs Reviewed  BASIC METABOLIC PANEL - Abnormal; Notable for the following components:      Result Value   Potassium 3.3 (*)    Glucose, Bld 159 (*)    Creatinine, Ser 1.16 (*)    GFR calc non Af Amer 49 (*)    GFR calc Af Amer 57 (*)    All other components within normal limits  HEPATIC FUNCTION PANEL - Abnormal; Notable for the following components:   Total Protein 8.5 (*)    Total Bilirubin 1.5 (*)    Indirect Bilirubin 1.3 (*)    All other components within normal limits  D-DIMER, QUANTITATIVE (NOT AT [facility]) - Abnormal; Notable for the following components:   D-Dimer, Quant 1.94 (*)    All other components within normal  limits  GROUP A STREP BY PCR  CBC  LIPASE, BLOOD  TROPONIN I  I-STAT TROPONIN, ED    EKG EKG Interpretation  Date/Time:  Saturday February 25 2018 14:03:26 EDT Ventricular Rate:  70 PR Interval:    QRS Duration: 100 QT Interval:  378 QTC Calculation: 408 R Axis:   34 Text Interpretation:  Sinus arrhythmia Borderline T wave abnormalities Confirmed by Julianne Rice [phone]) on 02/25/2018 3:07:19 PM   Radiology Dg Chest 2 View  Result Date: 02/25/2018 CLINICAL DATA:  Acute onset right-sided chest pain and shortness of breath. EXAM: CHEST - 2 VIEW COMPARISON:  Chest x-ray dated July 17, 2007. FINDINGS: Stable cardiomegaly. Normal pulmonary vascularity. No focal consolidation, pleural effusion, or pneumothorax. No acute osseous abnormality. IMPRESSION: No active cardiopulmonary disease.  Stable cardiomegaly. Electronically Signed   By: Titus Dubin M.D.   On: 02/25/2018 15:30   Ct Angio Chest Pe W And/or Wo Contrast  Result Date: 02/25/2018 CLINICAL DATA:  Chest pain and positive D-dimer. EXAM: CT ANGIOGRAPHY CHEST WITH CONTRAST TECHNIQUE: Multidetector CT imaging of the chest was performed using the standard protocol during bolus administration of intravenous contrast. Multiplanar CT image reconstructions and  MIPs were obtained to evaluate the vascular anatomy. CONTRAST:  150m ISOVUE-370 IOPAMIDOL (ISOVUE-370) INJECTION 76% COMPARISON:  None. FINDINGS: Cardiovascular: --Pulmonary arteries: Contrast injection is sufficient to demonstrate satisfactory opacification of the pulmonary arteries to the segmental level. There is no pulmonary embolus. The main pulmonary artery is within normal limits for size. --Aorta: Satisfactory opacification of the thoracic aorta. No aortic dissection or other acute aortic syndrome. Conventional 3 vessel aortic branching pattern. The aortic course and caliber are normal. There is no aortic atherosclerosis. --Heart: Moderate cardiomegaly with pericardial effusion measuring 7 mm in thickness. Mediastinum/Nodes: No mediastinal, hilar or axillary lymphadenopathy. The visualized thyroid and thoracic esophageal course are unremarkable. Lungs/Pleura: No pulmonary nodules or masses. No pleural effusion or pneumothorax. No focal airspace consolidation. No focal pleural abnormality. Upper Abdomen: Contrast bolus timing is not optimized for evaluation of the abdominal organs. Within this limitation, the visualized organs of the upper abdomen are normal. Musculoskeletal: No chest wall abnormality. No acute or significant osseous findings. Review of the MIP images confirms the above findings. IMPRESSION: No pulmonary embolus or other acute thoracic abnormality. Electronically Signed   By: KUlyses JarredM.D.   On: 02/25/2018 17:34    Procedures Procedures (including critical care time)  Medications Ordered in ED Medications  sodium chloride 0.9 % injection (  Given by Other 02/25/18 1659)  iopamidol (ISOVUE-370) 76 % injection (100 mLs  Contrast Given 02/25/18 1651)     Initial Impression / Assessment and Plan / ED Course  I have reviewed the triage vital signs and the nursing notes.  Pertinent labs & imaging results that were available during my care of the patient were reviewed by me and  considered in my medical decision making (see chart for details).     EKG without ischemic findings.  Troponin x2 is negative.  Patient did have elevated d-dimer but CT Angie of her chest without abnormality.  Patient is now symptom-free and asking to be discharged home.  Given her risk factors, have encouraged her to follow-up closely with cardiology.  Return precautions have been given.  Final Clinical Impressions(s) / ED Diagnoses   Final diagnoses:  Atypical chest pain    ED Discharge Orders    None       YJulianne Rice MD 02/25/18 2000

## 2018-02-27 ENCOUNTER — Telehealth: Payer: Self-pay

## 2018-02-27 NOTE — Telephone Encounter (Signed)
Patient was in ER over the weekend;however, she was not admitted even for observation and not qualified for TCM.  ER told her to follow up with cardiology and appears she has her annual appt with you on 11/22.  I do not see where she has made an appt yet with cardiology though.  Just let me know if you want me to pursue anything further for her.  Thanks.

## 2018-03-06 ENCOUNTER — Telehealth: Payer: Self-pay | Admitting: Family Medicine

## 2018-03-06 DIAGNOSIS — E119 Type 2 diabetes mellitus without complications: Secondary | ICD-10-CM

## 2018-03-06 NOTE — Telephone Encounter (Signed)
-----   Message from Wendi Maya, RT sent at 02/28/2018  9:35 AM EST ----- Regarding: Lab orders for Tuesday 03/07/18 Please enter CPE lab orders for 03/07/18. Thanks!

## 2018-03-07 ENCOUNTER — Other Ambulatory Visit (INDEPENDENT_AMBULATORY_CARE_PROVIDER_SITE_OTHER): Payer: 59

## 2018-03-07 DIAGNOSIS — E119 Type 2 diabetes mellitus without complications: Secondary | ICD-10-CM

## 2018-03-07 LAB — LIPID PANEL
Cholesterol: 117 mg/dL (ref 0–200)
HDL: 34.2 mg/dL — ABNORMAL LOW (ref >39.00)
LDL CALC: 63 mg/dL (ref 0–99)
NONHDL: 82.92
Total CHOL/HDL Ratio: 3
Triglycerides: 98 mg/dL (ref 0.0–149.0)
VLDL: 19.6 mg/dL (ref 0.0–40.0)

## 2018-03-07 LAB — COMPREHENSIVE METABOLIC PANEL
ALK PHOS: 51 U/L (ref 39–117)
ALT: 12 U/L (ref 0–35)
AST: 11 U/L (ref 0–37)
Albumin: 4.4 g/dL (ref 3.5–5.2)
BUN: 14 mg/dL (ref 6–23)
CHLORIDE: 103 meq/L (ref 96–112)
CO2: 29 mEq/L (ref 19–32)
Calcium: 10 mg/dL (ref 8.4–10.5)
Creatinine, Ser: 1 mg/dL (ref 0.40–1.20)
GFR: 59.44 mL/min — ABNORMAL LOW (ref >60.00)
GLUCOSE: 138 mg/dL — AB (ref 70–99)
POTASSIUM: 3.5 meq/L (ref 3.5–5.1)
Sodium: 140 mEq/L (ref 135–145)
TOTAL PROTEIN: 8.1 g/dL (ref 6.0–8.3)
Total Bilirubin: 1.1 mg/dL (ref 0.2–1.2)

## 2018-03-07 LAB — HEMOGLOBIN A1C: HEMOGLOBIN A1C: 7.6 % — AB (ref 4.6–6.5)

## 2018-03-08 ENCOUNTER — Telehealth: Payer: Self-pay | Admitting: *Deleted

## 2018-03-08 NOTE — Telephone Encounter (Signed)
Spoke to pt who states she is experiencing back pain; denies any urinary s/s and believes it may be muscular. Offered pt acute appt, but pt denied stating she has an upcoming CPE. Advised pt that should she worsen before her upcoming appt and want to be seen, she may contact us back as there is plenty of availability.

## 2018-03-08 NOTE — Telephone Encounter (Signed)
Agreed -

## 2018-03-17 ENCOUNTER — Encounter: Payer: Self-pay | Admitting: Family Medicine

## 2018-03-17 ENCOUNTER — Ambulatory Visit (INDEPENDENT_AMBULATORY_CARE_PROVIDER_SITE_OTHER): Payer: 59 | Admitting: Family Medicine

## 2018-03-17 VITALS — BP 120/80 | HR 65 | Temp 98.5°F | Ht 65.5 in | Wt 164.5 lb

## 2018-03-17 DIAGNOSIS — Z1231 Encounter for screening mammogram for malignant neoplasm of breast: Secondary | ICD-10-CM

## 2018-03-17 DIAGNOSIS — Z1211 Encounter for screening for malignant neoplasm of colon: Secondary | ICD-10-CM

## 2018-03-17 DIAGNOSIS — G8929 Other chronic pain: Secondary | ICD-10-CM

## 2018-03-17 DIAGNOSIS — E119 Type 2 diabetes mellitus without complications: Secondary | ICD-10-CM | POA: Diagnosis not present

## 2018-03-17 DIAGNOSIS — M545 Low back pain: Secondary | ICD-10-CM

## 2018-03-17 DIAGNOSIS — E785 Hyperlipidemia, unspecified: Secondary | ICD-10-CM | POA: Diagnosis not present

## 2018-03-17 DIAGNOSIS — N183 Chronic kidney disease, stage 3 unspecified: Secondary | ICD-10-CM

## 2018-03-17 DIAGNOSIS — Z0000 Encounter for general adult medical examination without abnormal findings: Secondary | ICD-10-CM | POA: Diagnosis not present

## 2018-03-17 DIAGNOSIS — Z23 Encounter for immunization: Secondary | ICD-10-CM | POA: Diagnosis not present

## 2018-03-17 DIAGNOSIS — I10 Essential (primary) hypertension: Secondary | ICD-10-CM

## 2018-03-17 DIAGNOSIS — R0789 Other chest pain: Secondary | ICD-10-CM

## 2018-03-17 LAB — HM DIABETES FOOT EXAM

## 2018-03-17 MED ORDER — HYDROCHLOROTHIAZIDE 25 MG PO TABS: 25.0000 mg | ORAL_TABLET | Freq: Every day | ORAL | 1 refills | Status: DC

## 2018-03-17 MED ORDER — ONETOUCH DELICA LANCETS FINE MISC: 3 refills | Status: DC

## 2018-03-17 MED ORDER — LOSARTAN POTASSIUM 100 MG PO TABS: 100.0000 mg | ORAL_TABLET | Freq: Every day | ORAL | 1 refills | Status: DC

## 2018-03-17 MED ORDER — ONETOUCH ULTRA 2 W/DEVICE KIT: PACK | 0 refills | Status: DC

## 2018-03-17 MED ORDER — METFORMIN HCL ER 500 MG PO TB24: ORAL_TABLET | ORAL | 1 refills | Status: DC

## 2018-03-17 MED ORDER — CYCLOBENZAPRINE HCL 10 MG PO TABS: 10.0000 mg | ORAL_TABLET | Freq: Every evening | ORAL | 0 refills | Status: DC | PRN

## 2018-03-17 MED ORDER — GLUCOSE BLOOD VI STRP: ORAL_STRIP | 3 refills | Status: DC

## 2018-03-17 NOTE — Assessment & Plan Note (Signed)
Well controlled. Continue current medication. On statin. 

## 2018-03-17 NOTE — Assessment & Plan Note (Signed)
Refilled muscle relaxant. 

## 2018-03-17 NOTE — Assessment & Plan Note (Signed)
Given high risk for CAD.. Will refer to cardiology for further eval of chest pain, DOE and fatigue.   Father with MI age 1950s, brother died with CVA or MI age 63

## 2018-03-17 NOTE — Assessment & Plan Note (Signed)
Well controlled. Continue current medication.  

## 2018-03-17 NOTE — Assessment & Plan Note (Signed)
Stable control. 

## 2018-03-17 NOTE — Patient Instructions (Addendum)
Make sure taking metformin every day.  Check blood sugar daily... If persistently fasting blood sugar > 120... Make sure you increase to three metformin a day.  Return stool cards for  Colon cancer screening.  Set up yearly eye exam and have them send us records please.

## 2018-03-17 NOTE — Progress Notes (Signed)
Subjective:    Patient ID: Dana Rogers, female    DOB: August 20, 1955, 62 y.o.   MRN: [id number]  HPI  The patient is here for annual wellness exam and preventative care.     Did have flare of low back pain in last several weeks.. Needs refill opf muscle relaxant.  Diabetes:   Not yet at goal on metformin and glipizide max. Lab Results  Component Value Date   HGBA1C 7.6 (H) 03/07/2018  Using medications without difficulties: Hypoglycemic episodes: Hyperglycemic episodes: Feet problems: none Blood Sugars averaging: eye exam within last year: due  Hypertension:    Good control on losartan HCTZ, amlodipine BP Readings from Last 3 Encounters:  03/17/18 120/80  02/25/18 (!) 151/91  12/13/17 130/80  Using medication without problems or lightheadedness:  Chest pain with exertion: none... She feels more fatigued worse in last 2 weeks, less endurance.  Was seen on 02/25/2018 with chest pain ( nonexertional, pressure, lasted 2-5 min off and on) , none further pain since... Neg labs, neg troponin Edema: none Short of breath:  Noted DOE in last  2 weeks Average home BPs: Other issues:   CKD stable GFR.. 59  Elevated Cholesterol:  LDL at goal on atorvastatin 20 mg daily Lab Results  Component Value Date   CHOL 117 03/07/2018   HDL 34.20 (L) 03/07/2018   LDLCALC 63 03/07/2018   TRIG 98.0 03/07/2018   CHOLHDL 3 03/07/2018  Using medications without problems: Muscle aches:  Diet compliance: moderate Exercise: none Other complaints:    Social History /Family History/Past Medical History reviewed in detail and updated in EMR if needed. Blood pressure 120/80, pulse 65, temperature 98.5 F (36.9 C), temperature source Oral, height 5' 5.5" (1.664 m), weight 164 lb 8 oz (74.6 kg).  Review of Systems  Constitutional: Negative for fatigue and fever.  HENT: Negative for congestion.   Eyes: Negative for pain.  Respiratory: Positive for shortness of breath. Negative for cough.     Cardiovascular: Positive for chest pain. Negative for palpitations and leg swelling.  Gastrointestinal: Negative for abdominal pain.  Genitourinary: Negative for dysuria and vaginal bleeding.  Musculoskeletal: Negative for back pain.  Neurological: Negative for syncope, light-headedness and headaches.  Psychiatric/Behavioral: Negative for dysphoric mood.       Objective:   Physical Exam  Constitutional: Vital signs are normal. She appears well-developed and well-nourished. She is cooperative.  Non-toxic appearance. She does not appear ill. No distress.  HENT:  Head: Normocephalic.  Right Ear: Hearing, tympanic membrane, external ear and ear canal normal.  Left Ear: Hearing, tympanic membrane, external ear and ear canal normal.  Nose: Nose normal.  Eyes: Pupils are equal, round, and reactive to light. Conjunctivae, EOM and lids are normal. Lids are everted and swept, no foreign bodies found.  Neck: Trachea normal and normal range of motion. Neck supple. Carotid bruit is not present. No thyroid mass and no thyromegaly present.  Cardiovascular: Normal rate, regular rhythm, S1 normal, S2 normal, normal heart sounds and intact distal pulses. Exam reveals no gallop.  No murmur heard. Pulmonary/Chest: Effort normal and breath sounds normal. No respiratory distress. She has no wheezes. She has no rhonchi. She has no rales.  Abdominal: Soft. Normal appearance and bowel sounds are normal. She exhibits no distension, no fluid wave, no abdominal bruit and no mass. There is no hepatosplenomegaly. There is no tenderness. There is no rebound, no guarding and no CVA tenderness. No hernia.  Lymphadenopathy:    She has no  cervical adenopathy.    She has no axillary adenopathy.  Neurological: She is alert. She has normal strength. No cranial nerve deficit or sensory deficit.  Skin: Skin is warm, dry and intact. No rash noted.  Psychiatric: Her speech is normal and behavior is normal. Judgment normal. Her  mood appears not anxious. Cognition and memory are normal. She does not exhibit a depressed mood.     Diabetic foot exam: Normal inspection No skin breakdown No calluses  Normal DP pulses Normal sensation to light touch and monofilament Nails normal      Assessment & Plan:  The patient's preventative maintenance and recommended screening tests for an annual wellness exam were reviewed in full today. Brought up to date unless services declined.  Counselled on the importance of diet, exercise, and its role in overall health and mortality. The patient's FH and SH was reviewed, including their home life, tobacco status, and drug and alcohol status.   Last PAP nml 2010, 2012..high risk HPV 05/2012, high risk HPV 08/2013,  And again in 2016 .Marland Kitchen. Referred to Dr. Jennette Kettleneal GYN, neg colposcopy..  2018 neg pap, neg HPV.Marland Kitchen. Repeat in 2021 Mammo 2010.. Due.  Colon cancer screening: Could not tolerate golytely in past, unable to do colonoscopy 2010. Plan yearly ifob. Vaccines up to date with PNA, Tdap, given flu  today.  Due for shingles.  DEXA: no early osteo in family.. Plan start 64.  Hep C screening done HIV: done

## 2018-03-17 NOTE — Assessment & Plan Note (Signed)
Inadequate control.. Still forgetting second dose of metformin poff and on... Not at goal in past 2 years even when taking regularly... increase metformin to 3 tabs daily.

## 2018-03-29 NOTE — Progress Notes (Signed)
Cardiology Office Note   Date:  03/31/2018   ID:  Dana Rogers, DOB 10-21-1955, MRN [id number]  PCP:  Jinny Sanders, MD  Cardiologist:   No primary care provider on file. Referring:  Jinny Sanders, MD  Chief Complaint  Patient presents with  . Chest Pain      History of Present Illness: Dana Rogers is a 62 y.o. female who is referred by Jinny Sanders, MD for evaluation of chest pain.  She has no prior cardiac history other than the evaluations for chest pain a couple of times in the past.  She had a negative stress echo in 2014.  I saw her cardiac catheterization that she had in 2004 and she had no coronary disease.  She does have cardiovascular risk factors however.  She was in the emergency department in early November with chest discomfort. I reviewed these records for this visit.    Her pain was somewhat sharp.  She also describes some dullness.  It was a left upper quadrant discomfort.  It happened at rest.  I see from the notes that she had mostly nausea and vomiting.  She did have an elevated d-dimer but a CT was unremarkable.  Enzymes were negative.  EKG demonstrated no acute changes.  She was referred here for further evaluation.  She does not exercise but she is active.  She owns to take cares.  She is raising her 12-year-old granddaughter.  She is active in church.  With this she denies any chest discomfort.  She has no shortness of breath, PND or orthopnea.  Is not having any palpitations, presyncope or syncope.  Past Medical History:  Diagnosis Date  . Diabetes mellitus   . Hyperlipidemia   . Hypertension   . Peripheral edema   . Rosacea     Past Surgical History:  Procedure Laterality Date  . CARDIAC CATHETERIZATION  2004  . CESAREAN SECTION     X2  . CYSTECTOMY     UNDER ARM AND SCALP     Current Outpatient Medications  Medication Sig Dispense Refill  . amLODipine (NORVASC) 10 MG tablet Take 1 tablet (10 mg total) by mouth daily. 90 tablet 3  .  atorvastatin (LIPITOR) 20 MG tablet TAKE 1 TABLET (20 MG TOTAL) BY MOUTH DAILY. 90 tablet 3  . Blood Glucose Monitoring Suppl (ONE TOUCH ULTRA 2) w/Device KIT Ck blood sugar twice a day and as directed. Dx E11.65 1 each 0  . cyclobenzaprine (FLEXERIL) 10 MG tablet Take 1 tablet (10 mg total) by mouth at bedtime as needed for muscle spasms. 15 tablet 0  . glipiZIDE (GLUCOTROL XL) 10 MG 24 hr tablet TAKE 1 TABLET (10 MG TOTAL) BY MOUTH DAILY. 90 tablet 3  . glucose blood (ONE TOUCH ULTRA TEST) test strip Ck blood sugar twice a day and as directed.Dx E11.65 100 each 3  . hydrochlorothiazide (HYDRODIURIL) 25 MG tablet Take 1 tablet (25 mg total) by mouth daily. 90 tablet 1  . losartan (COZAAR) 100 MG tablet Take 1 tablet (100 mg total) by mouth daily. 90 tablet 1  . metFORMIN (GLUCOPHAGE-XR) 500 MG 24 hr tablet TAKE 3 TABLETS BY MOUTH EVERY DAY WITH BREAKFAST or can split dose if needed 180 tablet 1  . ONETOUCH DELICA LANCETS FINE MISC Ck blood sugar twice a day and as directed. Dx E11.65 100 each 3   No current facility-administered medications for this visit.     Allergies:   Patient  has no known allergies.    Social History:  The patient  reports that she has never smoked. She has never used smokeless tobacco. She reports that she does not drink alcohol or use drugs.   Family History:  The patient's family history includes Alcohol abuse in her brother; Asthma in her brother; Cirrhosis in her brother; Coronary artery disease in her father; Diabetes in her brother and sister; Heart disease in her mother; Hypertension in her brother and sister; Sleep apnea in her mother; Stroke in her father.    ROS:  Please see the history of present illness.   Otherwise, review of systems are positive for none.   All other systems are reviewed and negative.    PHYSICAL EXAM: VS:  BP (!) 158/92 (BP Location: Left Arm, Patient Position: Sitting, Cuff Size: Normal)   Pulse (!) 57   Ht 5' 5.5" (1.664 m)   Wt  163 lb (73.9 kg)   BMI 26.71 kg/m  , BMI Body mass index is 26.71 kg/m. GENERAL:  Well appearing HEENT:  Pupils equal round and reactive, fundi not visualized, oral mucosa unremarkable NECK:  No jugular venous distention, waveform within normal limits, carotid upstroke brisk and symmetric, no bruits, no thyromegaly LYMPHATICS:  No cervical, inguinal adenopathy LUNGS:  Clear to auscultation bilaterally BACK:  No CVA tenderness CHEST:  Unremarkable HEART:  PMI not displaced or sustained,S1 and S2 within normal limits, no S3, no S4, no clicks, no rubs, no murmurs ABD:  Flat, positive bowel sounds normal in frequency in pitch, no bruits, no rebound, no guarding, no midline pulsatile mass, no hepatomegaly, no splenomegaly EXT:  2 plus pulses throughout, no edema, no cyanosis no clubbing SKIN:  No rashes no nodules NEURO:  Cranial nerves II through XII grossly intact, motor grossly intact throughout PSYCH:  Cognitively intact, oriented to person place and time    EKG:  EKG is not ordered today. The ekg ordered today demonstrates sinus rhythm, rate 70, axis within normal limits, intervals within normal limits, no acute ST-T wave changes.   Recent Labs: 02/25/2018: Hemoglobin 13.3; Platelets 168 03/07/2018: ALT 12; BUN 14; Creatinine, Ser 1.00; Potassium 3.5; Sodium 140    Lipid Panel    Component Value Date/Time   CHOL 117 03/07/2018 0907   TRIG 98.0 03/07/2018 0907   HDL 34.20 (L) 03/07/2018 0907   CHOLHDL 3 03/07/2018 0907   VLDL 19.6 03/07/2018 0907   LDLCALC 63 03/07/2018 0907     Lab Results  Component Value Date   HGBA1C 7.6 (H) 03/07/2018    Wt Readings from Last 3 Encounters:  03/31/18 163 lb (73.9 kg)  03/17/18 164 lb 8 oz (74.6 kg)  12/13/17 167 lb 8 oz (76 kg)      Other studies Reviewed: Additional studies/ records that were reviewed today include: None. Review of the above records demonstrates:  Please see elsewhere in the note.     ASSESSMENT AND  PLAN:   CHEST PAIN:   Patient's chest pain is atypical but she does have significant cardiovascular risk factors.  Therefore, she needs further cardiovascular testing. I will bring the patient back for a POET (Plain Old Exercise Test). This will allow me to screen for obstructive coronary disease, risk stratify and very importantly provide a prescription for exercise. We also discussed primary risk reduction.   HTN: Her blood pressure is elevated.  I am increasing Norvasc to 10 mg daily.  DM:    A1C is elevated as above.  We  had a long discussion about this and she will follow with Diona Browner, Amy E, MD  DYSLIPIDEMIA:   Her LDL is excellent and at target.  No change in therapy.   Current medicines are reviewed at length with the patient today.  The patient does not have concerns regarding medicines.  The following changes have been made:  no change  Labs/ tests ordered today include:   Orders Placed This Encounter  Procedures  . EXERCISE TOLERANCE TEST (ETT)     Disposition:   FU with me as needed.      Signed, Minus Breeding, MD  03/31/2018 12:05 PM    [place]

## 2018-03-31 ENCOUNTER — Ambulatory Visit (INDEPENDENT_AMBULATORY_CARE_PROVIDER_SITE_OTHER): Payer: 59 | Admitting: Cardiology

## 2018-03-31 ENCOUNTER — Encounter: Payer: Self-pay | Admitting: Cardiology

## 2018-03-31 VITALS — BP 158/92 | HR 57 | Ht 65.5 in | Wt 163.0 lb

## 2018-03-31 DIAGNOSIS — E785 Hyperlipidemia, unspecified: Secondary | ICD-10-CM | POA: Diagnosis not present

## 2018-03-31 DIAGNOSIS — E119 Type 2 diabetes mellitus without complications: Secondary | ICD-10-CM | POA: Diagnosis not present

## 2018-03-31 DIAGNOSIS — I10 Essential (primary) hypertension: Secondary | ICD-10-CM

## 2018-03-31 DIAGNOSIS — R0789 Other chest pain: Secondary | ICD-10-CM | POA: Diagnosis not present

## 2018-03-31 MED ORDER — AMLODIPINE BESYLATE 10 MG PO TABS: 10.0000 mg | ORAL_TABLET | Freq: Every day | ORAL | 3 refills | Status: DC

## 2018-03-31 NOTE — Patient Instructions (Signed)
Medication Instructions:  INCREASE- Amlodipine 10 mg daily  If you need a refill on your cardiac medications before your next appointment, please call your pharmacy.  Labwork: None Ordered   If you have labs (blood work) drawn today and your tests are completely normal, you will receive your results only by: Marland Kitchen. MyChart Message (if you have MyChart) OR . A paper copy in the mail If you have any lab test that is abnormal or we need to change your treatment, we will call you to review the results.  Testing/Procedures: Your physician has requested that you have an exercise tolerance test. For further information please visit [email or web address]. Please also follow instruction sheet, as given.   Follow-Up: . You will need a follow up appointment in As Needed.    At [facility], you and your health needs are our priority.  As part of our continuing mission to provide you with exceptional heart care, we have created designated Provider Care Teams.  These Care Teams include your primary Cardiologist (physician) and Advanced Practice Providers (APPs -  Physician Assistants and Nurse Practitioners) who all work together to provide you with the care you need, when you need it.   Thank you for choosing [facility] at [facility]!!

## 2018-04-03 ENCOUNTER — Other Ambulatory Visit: Payer: Self-pay | Admitting: Family Medicine

## 2018-04-07 ENCOUNTER — Telehealth (HOSPITAL_COMMUNITY): Payer: Self-pay

## 2018-04-07 NOTE — Telephone Encounter (Signed)
Encounter complete. 

## 2018-04-12 ENCOUNTER — Ambulatory Visit (HOSPITAL_COMMUNITY)
Admission: RE | Admit: 2018-04-12 | Discharge: 2018-04-12 | Disposition: A | Discharge status: Home / Self Care | Payer: 59 | Source: Ambulatory Visit | Attending: Cardiovascular Disease | Admitting: Cardiovascular Disease

## 2018-04-12 DIAGNOSIS — R0789 Other chest pain: Secondary | ICD-10-CM | POA: Diagnosis present

## 2018-04-12 LAB — EXERCISE TOLERANCE TEST
CHL CUP RESTING HR STRESS: 58 {beats}/min
CHL RATE OF PERCEIVED EXERTION: 17
CSEPED: 7 min
CSEPEW: 9 METS
CSEPPHR: 144 {beats}/min
Exercise duration (sec): 18 s
MPHR: 157 {beats}/min
Percent HR: 91 %

## 2018-04-14 ENCOUNTER — Other Ambulatory Visit: Payer: Self-pay | Admitting: Family Medicine

## 2018-10-10 ENCOUNTER — Other Ambulatory Visit: Payer: Self-pay | Admitting: Family Medicine

## 2018-10-10 NOTE — Telephone Encounter (Signed)
Please schedulediabetes follow up with  fasting labs prior for Dr. Diona Browner.

## 2018-10-19 NOTE — Telephone Encounter (Signed)
Labs 7/13 Appointment 7/17 Pt aware

## 2018-11-02 ENCOUNTER — Telehealth: Payer: Self-pay

## 2018-11-02 NOTE — Telephone Encounter (Signed)
Left detailed VM w COVID screen and back door lab info   

## 2018-11-03 ENCOUNTER — Telehealth: Payer: Self-pay | Admitting: Family Medicine

## 2018-11-03 DIAGNOSIS — E785 Hyperlipidemia, unspecified: Secondary | ICD-10-CM

## 2018-11-03 DIAGNOSIS — E119 Type 2 diabetes mellitus without complications: Secondary | ICD-10-CM

## 2018-11-03 NOTE — Telephone Encounter (Signed)
-----   Message from Ellamae Sia sent at 11/01/2018  2:29 PM EDT ----- Regarding: Lab orders for Monday, 7.13.20 Lab orders for dm f/u

## 2018-11-06 ENCOUNTER — Other Ambulatory Visit (INDEPENDENT_AMBULATORY_CARE_PROVIDER_SITE_OTHER): Payer: 59

## 2018-11-06 DIAGNOSIS — E119 Type 2 diabetes mellitus without complications: Secondary | ICD-10-CM

## 2018-11-06 LAB — LIPID PANEL
Cholesterol: 122 mg/dL (ref 0–200)
HDL: 39.5 mg/dL (ref >39.00)
LDL Cholesterol: 67 mg/dL (ref 0–99)
NonHDL: 82.26
Total CHOL/HDL Ratio: 3
Triglycerides: 76 mg/dL (ref 0.0–149.0)
VLDL: 15.2 mg/dL (ref 0.0–40.0)

## 2018-11-06 LAB — COMPREHENSIVE METABOLIC PANEL
ALT: 17 U/L (ref 0–35)
AST: 17 U/L (ref 0–37)
Albumin: 4.4 g/dL (ref 3.5–5.2)
Alkaline Phosphatase: 63 U/L (ref 39–117)
BUN: 17 mg/dL (ref 6–23)
CO2: 27 mEq/L (ref 19–32)
Calcium: 9.7 mg/dL (ref 8.4–10.5)
Chloride: 103 mEq/L (ref 96–112)
Creatinine, Ser: 1.07 mg/dL (ref 0.40–1.20)
GFR: 51.61 mL/min — ABNORMAL LOW (ref >60.00)
Glucose, Bld: 194 mg/dL — ABNORMAL HIGH (ref 70–99)
Potassium: 3.4 mEq/L — ABNORMAL LOW (ref 3.5–5.1)
Sodium: 139 mEq/L (ref 135–145)
Total Bilirubin: 0.9 mg/dL (ref 0.2–1.2)
Total Protein: 7.6 g/dL (ref 6.0–8.3)

## 2018-11-06 LAB — HEMOGLOBIN A1C: Hgb A1c MFr Bld: 8.5 % — ABNORMAL HIGH (ref 4.6–6.5)

## 2018-11-10 ENCOUNTER — Ambulatory Visit (INDEPENDENT_AMBULATORY_CARE_PROVIDER_SITE_OTHER): Payer: 59 | Admitting: Family Medicine

## 2018-11-10 ENCOUNTER — Encounter: Payer: Self-pay | Admitting: Family Medicine

## 2018-11-10 ENCOUNTER — Other Ambulatory Visit: Payer: Self-pay

## 2018-11-10 DIAGNOSIS — I10 Essential (primary) hypertension: Secondary | ICD-10-CM | POA: Diagnosis not present

## 2018-11-10 DIAGNOSIS — E119 Type 2 diabetes mellitus without complications: Secondary | ICD-10-CM

## 2018-11-10 DIAGNOSIS — E785 Hyperlipidemia, unspecified: Secondary | ICD-10-CM | POA: Diagnosis not present

## 2018-11-10 DIAGNOSIS — N183 Chronic kidney disease, stage 3 unspecified: Secondary | ICD-10-CM

## 2018-11-10 LAB — HM DIABETES FOOT EXAM

## 2018-11-10 MED ORDER — DAPAGLIFLOZIN PROPANEDIOL 5 MG PO TABS: 5.0000 mg | ORAL_TABLET | Freq: Every day | ORAL | 11 refills | Status: DC

## 2018-11-10 NOTE — Progress Notes (Signed)
Chief Complaint  Patient presents with  . Diabetes    Pt is here today to F/U with DM.  Pt had fasting labs on 7.17.20.  A1C has risen from 7.6 to 8.5. At November visit Metformin was increased to tid. She is due for her Tdap.  She does not take her Metformin tid as it makes her feel bad. She does take her Glipizide.    History of Present Illness: HPI  Diabetes:   She is only able to take 1 tablet of metformin... higher causes nausea. Tolerating glipizide Using medications without difficulties: Hypoglycemic episodes: Hyperglycemic episodes: Feet problems: Blood Sugars averaging: eye exam within last year:   Exercise: walking some  At work   GFR 51.6   Body mass index is 27.66 kg/m.  Wt Readings from Last 3 Encounters:  11/10/18 168 lb 12.8 oz (76.6 kg)  03/31/18 163 lb (73.9 kg)  03/17/18 164 lb 8 oz (74.6 kg)   Elevated Cholesterol:  At gaol on atorvastati. Lab Results  Component Value Date   CHOL 122 11/06/2018   HDL 39.50 11/06/2018   LDLCALC 67 11/06/2018   TRIG 76.0 11/06/2018   CHOLHDL 3 11/06/2018  Using medications without problems: Muscle aches:  Diet compliance: poor    Hypertension:    BP Readings from Last 3 Encounters:  11/10/18 140/84  03/31/18 (!) 158/92  03/17/18 120/80  Using medication without problems or lightheadedness: none Chest pain with exertion:none Edema: off and on.. more lately and sarp pain at side of lower leg at times.. more slat lately Short of breath:none Average home BPs: Other issues:  COVID 19 screen No recent travel or known exposure to [place] The patient denies respiratory symptoms of COVID 19 at this time.  The importance of social distancing was discussed today.   ROS    Past Medical History:  Diagnosis Date  . Diabetes mellitus   . Hyperlipidemia   . Hypertension   . Peripheral edema   . Rosacea     reports that she has never smoked. She has never used smokeless tobacco. She reports that she does not drink  alcohol or use drugs.   Current Outpatient Medications:  .  amLODipine (NORVASC) 10 MG tablet, Take 1 tablet (10 mg total) by mouth daily., Disp: 90 tablet, Rfl: 3 .  atorvastatin (LIPITOR) 20 MG tablet, TAKE 1 TABLET (20 MG TOTAL) BY MOUTH DAILY., Disp: 90 tablet, Rfl: 3 .  Blood Glucose Monitoring Suppl (ONE TOUCH ULTRA 2) w/Device KIT, Ck blood sugar twice a day and as directed. Dx E11.65, Disp: 1 each, Rfl: 0 .  cyclobenzaprine (FLEXERIL) 10 MG tablet, Take 1 tablet (10 mg total) by mouth at bedtime as needed for muscle spasms., Disp: 15 tablet, Rfl: 0 .  glipiZIDE (GLUCOTROL XL) 10 MG 24 hr tablet, TAKE 1 TABLET (10 MG TOTAL) BY MOUTH DAILY., Disp: 90 tablet, Rfl: 3 .  glucose blood (ONE TOUCH ULTRA TEST) test strip, Ck blood sugar twice a day and as directed.Dx E11.65, Disp: 100 each, Rfl: 3 .  hydrochlorothiazide (HYDRODIURIL) 25 MG tablet, TAKE 1 TABLET BY MOUTH EVERY DAY, Disp: 90 tablet, Rfl: 0 .  losartan (COZAAR) 100 MG tablet, Take 1 tablet (100 mg total) by mouth daily., Disp: 90 tablet, Rfl: 1 .  metFORMIN (GLUCOPHAGE-XR) 500 MG 24 hr tablet, TAKE 3 TABLETS BY MOUTH EVERY DAY WITH BREAKFAST or can split dose if needed (Patient taking differently: TAKE 2 TABLETS BY MOUTH EVERY DAY WITH BREAKFAST or can split dose  if needed), Disp: 180 tablet, Rfl: 1 .  ONETOUCH DELICA LANCETS FINE MISC, Ck blood sugar twice a day and as directed. Dx E11.65, Disp: 100 each, Rfl: 3   Observations/Objective: Blood pressure 140/84, pulse 75, temperature 98.1 F (36.7 C), temperature source Oral, height 5' 5.5" (1.664 m), weight 168 lb 12.8 oz (76.6 kg), SpO2 95 %.  Physical Exam Constitutional:      General: She is not in acute distress.    Appearance: Normal appearance. She is well-developed. She is not ill-appearing or toxic-appearing.  HENT:     Head: Normocephalic.     Right Ear: Hearing, tympanic membrane, ear canal and external ear normal. Tympanic membrane is not erythematous, retracted or  bulging.     Left Ear: Hearing, tympanic membrane, ear canal and external ear normal. Tympanic membrane is not erythematous, retracted or bulging.     Nose: No mucosal edema or rhinorrhea.     Right Sinus: No maxillary sinus tenderness or frontal sinus tenderness.     Left Sinus: No maxillary sinus tenderness or frontal sinus tenderness.     Mouth/Throat:     Pharynx: Uvula midline.  Eyes:     General: Lids are normal. Lids are everted, no foreign bodies appreciated.     Conjunctiva/sclera: Conjunctivae normal.     Pupils: Pupils are equal, round, and reactive to light.  Neck:     Musculoskeletal: Normal range of motion and neck supple.     Thyroid: No thyroid mass or thyromegaly.     Vascular: No carotid bruit.     Trachea: Trachea normal.  Cardiovascular:     Rate and Rhythm: Normal rate and regular rhythm.     Pulses: Normal pulses.     Heart sounds: Normal heart sounds, S1 normal and S2 normal. No murmur. No friction rub. No gallop.   Pulmonary:     Effort: Pulmonary effort is normal. No tachypnea or respiratory distress.     Breath sounds: Normal breath sounds. No decreased breath sounds, wheezing, rhonchi or rales.  Abdominal:     General: Bowel sounds are normal.     Palpations: Abdomen is soft.     Tenderness: There is no abdominal tenderness.  Skin:    General: Skin is warm and dry.     Findings: No rash.  Neurological:     Mental Status: She is alert.  Psychiatric:        Mood and Affect: Mood is not anxious or depressed.        Speech: Speech normal.        Behavior: Behavior normal. Behavior is cooperative.        Thought Content: Thought content normal.        Judgment: Judgment normal.    Diabetic foot exam: Normal inspection No skin breakdown No calluses  Normal DP pulses Normal sensation to light touch and monofilament Nails  Great toe bilaterally discolored   Assessment and Plan      Eliezer Lofts, MD

## 2018-11-10 NOTE — Assessment & Plan Note (Signed)
At goal on statin 

## 2018-11-10 NOTE — Patient Instructions (Addendum)
Decrease bread, biscuits, potatos etc.  Stop sweet tea and lemonade... go back to water.  Continue metformin at 1 tab daoly.  Stop glipizide Start farxiga 5 mg daily. Check blood pressure at home.Marland Kitchen goal < 140/90.  if able to.. measure blood sugar at home.. fasting  Goal < 120.

## 2018-11-10 NOTE — Assessment & Plan Note (Signed)
Worsened control.. cannot tolerate more than 1 tab of metformin daily.  Stop glipizide ans start SGLT2i for kindey protection. GFR  At 51. Re-eval in 3 months.

## 2018-11-10 NOTE — Assessment & Plan Note (Signed)
Borderline control.. decrease salt.  Follow at home.. will call if > 140/90.

## 2018-11-10 NOTE — Assessment & Plan Note (Signed)
Stable control. 

## 2018-11-28 ENCOUNTER — Other Ambulatory Visit: Payer: Self-pay | Admitting: Family Medicine

## 2018-11-29 ENCOUNTER — Other Ambulatory Visit: Payer: Self-pay | Admitting: Family Medicine

## 2018-12-05 ENCOUNTER — Other Ambulatory Visit: Payer: Self-pay | Admitting: *Deleted

## 2018-12-05 MED ORDER — HYDROCHLOROTHIAZIDE 25 MG PO TABS: 25.0000 mg | ORAL_TABLET | Freq: Every day | ORAL | 1 refills | Status: DC

## 2019-02-08 ENCOUNTER — Telehealth: Payer: Self-pay | Admitting: Family Medicine

## 2019-02-08 DIAGNOSIS — E119 Type 2 diabetes mellitus without complications: Secondary | ICD-10-CM

## 2019-02-08 NOTE — Telephone Encounter (Signed)
-----   Message from Ellamae Sia sent at 02/06/2019  3:01 PM EDT ----- Regarding: Lab orders for Thursday, 10.22.20 Lab orders for a 3 month follow up appt.

## 2019-02-15 ENCOUNTER — Other Ambulatory Visit (INDEPENDENT_AMBULATORY_CARE_PROVIDER_SITE_OTHER): Payer: 59

## 2019-02-15 ENCOUNTER — Other Ambulatory Visit: Payer: Self-pay

## 2019-02-15 DIAGNOSIS — E119 Type 2 diabetes mellitus without complications: Secondary | ICD-10-CM | POA: Diagnosis not present

## 2019-02-15 LAB — LIPID PANEL
Cholesterol: 138 mg/dL (ref 0–200)
HDL: 34 mg/dL — ABNORMAL LOW (ref >39.00)
LDL Cholesterol: 83 mg/dL (ref 0–99)
NonHDL: 103.96
Total CHOL/HDL Ratio: 4
Triglycerides: 107 mg/dL (ref 0.0–149.0)
VLDL: 21.4 mg/dL (ref 0.0–40.0)

## 2019-02-15 LAB — HEMOGLOBIN A1C: Hgb A1c MFr Bld: 8.5 % — ABNORMAL HIGH (ref 4.6–6.5)

## 2019-02-22 ENCOUNTER — Encounter: Payer: Self-pay | Admitting: Family Medicine

## 2019-02-22 ENCOUNTER — Ambulatory Visit: Payer: 59 | Admitting: Family Medicine

## 2019-02-22 ENCOUNTER — Other Ambulatory Visit: Payer: Self-pay

## 2019-02-22 ENCOUNTER — Ambulatory Visit (INDEPENDENT_AMBULATORY_CARE_PROVIDER_SITE_OTHER): Payer: 59 | Admitting: Family Medicine

## 2019-02-22 VITALS — BP 130/80 | HR 70 | Temp 98.1°F | Ht 65.5 in | Wt 169.5 lb

## 2019-02-22 DIAGNOSIS — Z23 Encounter for immunization: Secondary | ICD-10-CM

## 2019-02-22 DIAGNOSIS — E785 Hyperlipidemia, unspecified: Secondary | ICD-10-CM

## 2019-02-22 DIAGNOSIS — E119 Type 2 diabetes mellitus without complications: Secondary | ICD-10-CM | POA: Diagnosis not present

## 2019-02-22 DIAGNOSIS — I10 Essential (primary) hypertension: Secondary | ICD-10-CM

## 2019-02-22 LAB — HM DIABETES FOOT EXAM

## 2019-02-22 MED ORDER — TETANUS-DIPHTH-ACELL PERTUSSIS 5-2.5-18.5 LF-MCG/0.5 IM SUSP: 0.5000 mL | Freq: Once | INTRAMUSCULAR | Status: DC

## 2019-02-22 NOTE — Assessment & Plan Note (Signed)
Well controlled. Continue current medication.  

## 2019-02-22 NOTE — Progress Notes (Signed)
Chief Complaint  Patient presents with  . Diabetes    History of Present Illness: HPI   63 year old female presents for DM follow up  Diabetes:   No improvement in last 3 months  She has not stopped the glipizide and NOT started dapagliflozin. She has not been taking metformin in last 2 weeks given concerns.  Lab Results  Component Value Date   HGBA1C 8.5 (H) 02/15/2019  Using medications without difficulties: Hypoglycemic episodes: Hyperglycemic episodes: Feet problems:no ulcers Blood Sugars averaging:not checking eye exam within last year: OVERDUE  Elevated Cholesterol:  At goal on atorvastatin. Lab Results  Component Value Date   CHOL 138 02/15/2019   HDL 34.00 (L) 02/15/2019   LDLCALC 83 02/15/2019   TRIG 107.0 02/15/2019   CHOLHDL 4 02/15/2019  Using medications without problems: Muscle aches:  Diet compliance:poor Exercise:occ Other complaints:  Hypertension:   Improved control. BP Readings from Last 3 Encounters:  02/22/19 130/80  11/10/18 140/84  03/31/18 (!) 158/92   Using medication without problems or lightheadedness:  none Chest pain with exertion:none Edema:noen Short of breath:noen Average home BPs: Other issues:   COVID 19 screen No recent travel or known exposure to [place] The patient denies respiratory symptoms of COVID 19 at this time.  The importance of social distancing was discussed today.   Review of Systems  Constitutional: Negative for chills and fever.  HENT: Negative for congestion and ear pain.   Eyes: Negative for pain and redness.  Respiratory: Negative for cough and shortness of breath.   Cardiovascular: Negative for chest pain, palpitations and leg swelling.  Gastrointestinal: Negative for abdominal pain, blood in stool, constipation, diarrhea, nausea and vomiting.  Genitourinary: Negative for dysuria.  Musculoskeletal: Negative for falls and myalgias.  Skin: Negative for rash.  Neurological: Negative for dizziness.   Psychiatric/Behavioral: Negative for depression. The patient is not nervous/anxious.       Past Medical History:  Diagnosis Date  . Diabetes mellitus   . Hyperlipidemia   . Hypertension   . Peripheral edema   . Rosacea     reports that she has never smoked. She has never used smokeless tobacco. She reports that she does not drink alcohol or use drugs.   Current Outpatient Medications:  .  amLODipine (NORVASC) 10 MG tablet, Take 1 tablet (10 mg total) by mouth daily., Disp: 90 tablet, Rfl: 3 .  atorvastatin (LIPITOR) 20 MG tablet, TAKE 1 TABLET (20 MG TOTAL) BY MOUTH DAILY., Disp: 90 tablet, Rfl: 3 .  Blood Glucose Monitoring Suppl (ONE TOUCH ULTRA 2) w/Device KIT, Ck blood sugar twice a day and as directed. Dx E11.65, Disp: 1 each, Rfl: 0 .  cyclobenzaprine (FLEXERIL) 10 MG tablet, Take 1 tablet (10 mg total) by mouth at bedtime as needed for muscle spasms., Disp: 15 tablet, Rfl: 0 .  dapagliflozin propanediol (FARXIGA) 5 MG TABS tablet, Take 5 mg by mouth daily., Disp: 30 tablet, Rfl: 11 .  glucose blood (ONE TOUCH ULTRA TEST) test strip, Ck blood sugar twice a day and as directed.Dx E11.65, Disp: 100 each, Rfl: 3 .  hydrochlorothiazide (HYDRODIURIL) 25 MG tablet, Take 1 tablet (25 mg total) by mouth daily., Disp: 90 tablet, Rfl: 1 .  losartan (COZAAR) 100 MG tablet, Take 1 tablet (100 mg total) by mouth daily., Disp: 90 tablet, Rfl: 1 .  metFORMIN (GLUCOPHAGE-XR) 500 MG 24 hr tablet, TAKE 2 TABLETS BY MOUTH EVERY DAY WITH BREAKFAST or can split dose if needed, Disp: 180 tablet, Rfl:  1 .  ONETOUCH DELICA LANCETS FINE MISC, Ck blood sugar twice a day and as directed. Dx E11.65, Disp: 100 each, Rfl: 3   Observations/Objective: Blood pressure 130/80, pulse 70, temperature 98.1 F (36.7 C), temperature source Temporal, height 5' 5.5" (1.664 m), weight 169 lb 8 oz (76.9 kg), SpO2 96 %.  Physical Exam Constitutional:      General: She is not in acute distress.    Appearance: Normal  appearance. She is well-developed. She is not ill-appearing or toxic-appearing.  HENT:     Head: Normocephalic.     Right Ear: Hearing, tympanic membrane, ear canal and external ear normal. Tympanic membrane is not erythematous, retracted or bulging.     Left Ear: Hearing, tympanic membrane, ear canal and external ear normal. Tympanic membrane is not erythematous, retracted or bulging.     Nose: No mucosal edema or rhinorrhea.     Right Sinus: No maxillary sinus tenderness or frontal sinus tenderness.     Left Sinus: No maxillary sinus tenderness or frontal sinus tenderness.     Mouth/Throat:     Pharynx: Uvula midline.  Eyes:     General: Lids are normal. Lids are everted, no foreign bodies appreciated.     Conjunctiva/sclera: Conjunctivae normal.     Pupils: Pupils are equal, round, and reactive to light.  Neck:     Musculoskeletal: Normal range of motion and neck supple.     Thyroid: No thyroid mass or thyromegaly.     Vascular: No carotid bruit.     Trachea: Trachea normal.  Cardiovascular:     Rate and Rhythm: Normal rate and regular rhythm.     Pulses: Normal pulses.     Heart sounds: Normal heart sounds, S1 normal and S2 normal. No murmur. No friction rub. No gallop.   Pulmonary:     Effort: Pulmonary effort is normal. No tachypnea or respiratory distress.     Breath sounds: Normal breath sounds. No decreased breath sounds, wheezing, rhonchi or rales.  Abdominal:     General: Bowel sounds are normal.     Palpations: Abdomen is soft.     Tenderness: There is no abdominal tenderness.  Skin:    General: Skin is warm and dry.     Findings: No rash.  Neurological:     Mental Status: She is alert.  Psychiatric:        Mood and Affect: Mood is not anxious or depressed.        Speech: Speech normal.        Behavior: Behavior normal. Behavior is cooperative.        Thought Content: Thought content normal.        Judgment: Judgment normal.    Diabetic foot exam: Normal  inspection No skin breakdown No calluses  Normal DP pulses Normal sensation to light touch and monofilament Nails normal   Assessment and Plan      Eliezer Lofts, MD

## 2019-02-22 NOTE — Patient Instructions (Signed)
Try glipizide and and dapagliflozin, hold metformin for now. Stop biscuits. Email fasting blood sugar in the 2 weeks.Marland Kitchen gaol < 120, 2 hours after meals goal < 180.

## 2019-02-22 NOTE — Assessment & Plan Note (Signed)
Reassured patient about metformin but she would still liek to try dapagliflozin along with galipide for control.. will cal in 1-2 weeks with FBS. Also improve diet and increase exercise.  Follow up POC A1C in 3 months.

## 2019-04-07 ENCOUNTER — Ambulatory Visit
Admission: RE | Admit: 2019-04-07 | Discharge: 2019-04-07 | Disposition: A | Discharge status: Home / Self Care | Payer: 59 | Source: Ambulatory Visit | Attending: Family Medicine | Admitting: Family Medicine

## 2019-04-07 ENCOUNTER — Other Ambulatory Visit: Payer: Self-pay

## 2019-04-07 DIAGNOSIS — Z1231 Encounter for screening mammogram for malignant neoplasm of breast: Secondary | ICD-10-CM

## 2019-05-09 ENCOUNTER — Other Ambulatory Visit: Payer: Self-pay | Admitting: Family Medicine

## 2019-05-25 ENCOUNTER — Ambulatory Visit: Payer: 59 | Admitting: Family Medicine

## 2019-05-29 ENCOUNTER — Ambulatory Visit (INDEPENDENT_AMBULATORY_CARE_PROVIDER_SITE_OTHER): Payer: 59 | Admitting: Family Medicine

## 2019-05-29 ENCOUNTER — Other Ambulatory Visit: Payer: Self-pay

## 2019-05-29 ENCOUNTER — Encounter: Payer: Self-pay | Admitting: Family Medicine

## 2019-05-29 VITALS — BP 118/74 | HR 96 | Temp 97.8°F | Ht 65.5 in | Wt 168.5 lb

## 2019-05-29 DIAGNOSIS — E119 Type 2 diabetes mellitus without complications: Secondary | ICD-10-CM | POA: Diagnosis not present

## 2019-05-29 DIAGNOSIS — N183 Chronic kidney disease, stage 3 unspecified: Secondary | ICD-10-CM

## 2019-05-29 LAB — POCT GLYCOSYLATED HEMOGLOBIN (HGB A1C): Hemoglobin A1C: 8.4 % — AB (ref 4.0–5.6)

## 2019-05-29 MED ORDER — DAPAGLIFLOZIN PROPANEDIOL 10 MG PO TABS: 10.0000 mg | ORAL_TABLET | Freq: Every day | ORAL | 11 refills | Status: DC

## 2019-05-29 NOTE — Patient Instructions (Addendum)
Get yearly eye exam given diabetes history.  Increase farxiga (dapagliflozin) to 10 mg daily.  Work  On low carbohydrate diet and increase exercise.

## 2019-05-29 NOTE — Assessment & Plan Note (Signed)
Inadequate control. Pt not eating well but compliant now with meds.  She is hesitant still about metfomrin. She will  Increase farxiga to max at 10 mg daily given no cost or SE issues. Continue glipizide as well.

## 2019-05-29 NOTE — Assessment & Plan Note (Addendum)
GFR > 45.  SGLT2i is a great option for her to protect her kidneys.

## 2019-05-29 NOTE — Progress Notes (Signed)
Chief Complaint  Patient presents with  . 3 month follow up  . Diabetes    History of Present Illness: HPI   64 year old female patient presents for 3 month follow up on DM.  Diabetes:  . At last OV 01/2019 given pt concerns about metformin despite reassurance... started dapagliflozin and glipizide. She Is compliant with meds.  No SE.  Minimal improvement in A1C. Lab Results  Component Value Date   HGBA1C 8.4 (A) 05/29/2019  Using medications without difficulties: Hypoglycemic episodes: none Hyperglycemic episodes:none Feet problems: no ulcer Blood Sugars averaging:  Not hecking lately. eye exam within last year: overdue    This visit occurred during the SARS-CoV-2 public health emergency.  Safety protocols were in place, including screening questions prior to the visit, additional usage of staff PPE, and extensive cleaning of exam room while observing appropriate contact time as indicated for disinfecting solutions.   COVID 19 screen:  No recent travel or known exposure to COVID19 The patient denies respiratory symptoms of COVID 19 at this time. The importance of social distancing was discussed today.     Review of Systems  Constitutional: Negative for chills and fever.  HENT: Negative for congestion and ear pain.   Eyes: Negative for pain and redness.  Respiratory: Negative for cough and shortness of breath.   Cardiovascular: Negative for chest pain, palpitations and leg swelling.  Gastrointestinal: Negative for abdominal pain, blood in stool, constipation, diarrhea, nausea and vomiting.  Genitourinary: Negative for dysuria.  Musculoskeletal: Negative for falls and myalgias.  Skin: Negative for rash.  Neurological: Negative for dizziness.  Psychiatric/Behavioral: Negative for depression. The patient is not nervous/anxious.       Past Medical History:  Diagnosis Date  . Diabetes mellitus   . Hyperlipidemia   . Hypertension   . Peripheral edema   . Rosacea      reports that she has never smoked. She has never used smokeless tobacco. She reports that she does not drink alcohol or use drugs.   Current Outpatient Medications:  .  amLODipine (NORVASC) 10 MG tablet, Take 1 tablet (10 mg total) by mouth daily., Disp: 90 tablet, Rfl: 3 .  atorvastatin (LIPITOR) 20 MG tablet, TAKE 1 TABLET (20 MG TOTAL) BY MOUTH DAILY., Disp: 90 tablet, Rfl: 3 .  Blood Glucose Monitoring Suppl (ONE TOUCH ULTRA 2) w/Device KIT, Ck blood sugar twice a day and as directed. Dx E11.65, Disp: 1 each, Rfl: 0 .  cyclobenzaprine (FLEXERIL) 10 MG tablet, Take 1 tablet (10 mg total) by mouth at bedtime as needed for muscle spasms., Disp: 15 tablet, Rfl: 0 .  dapagliflozin propanediol (FARXIGA) 5 MG TABS tablet, Take 5 mg by mouth daily., Disp: 30 tablet, Rfl: 11 .  glucose blood (ONE TOUCH ULTRA TEST) test strip, Ck blood sugar twice a day and as directed.Dx E11.65, Disp: 100 each, Rfl: 3 .  hydrochlorothiazide (HYDRODIURIL) 25 MG tablet, Take 1 tablet (25 mg total) by mouth daily., Disp: 90 tablet, Rfl: 1 .  losartan (COZAAR) 100 MG tablet, Take 1 tablet (100 mg total) by mouth daily., Disp: 90 tablet, Rfl: 1 .  metFORMIN (GLUCOPHAGE-XR) 500 MG 24 hr tablet, TAKE 2 TABLETS BY MOUTH EVERY DAY WITH BREAKFAST or can split dose if needed, Disp: 180 tablet, Rfl: 1 .  ONETOUCH DELICA LANCETS FINE MISC, Ck blood sugar twice a day and as directed. Dx E11.65, Disp: 100 each, Rfl: 3   Observations/Objective: Blood pressure 118/74, pulse 96, height 5' 5.5" (1.664 m),  weight 168 lb 8 oz (76.4 kg), SpO2 95 %.  Physical Exam Constitutional:      General: She is not in acute distress.    Appearance: Normal appearance. She is well-developed. She is not ill-appearing or toxic-appearing.  HENT:     Head: Normocephalic.     Right Ear: Hearing, tympanic membrane, ear canal and external ear normal. Tympanic membrane is not erythematous, retracted or bulging.     Left Ear: Hearing, tympanic membrane,  ear canal and external ear normal. Tympanic membrane is not erythematous, retracted or bulging.     Nose: No mucosal edema or rhinorrhea.     Right Sinus: No maxillary sinus tenderness or frontal sinus tenderness.     Left Sinus: No maxillary sinus tenderness or frontal sinus tenderness.     Mouth/Throat:     Pharynx: Uvula midline.  Eyes:     General: Lids are normal. Lids are everted, no foreign bodies appreciated.     Conjunctiva/sclera: Conjunctivae normal.     Pupils: Pupils are equal, round, and reactive to light.  Neck:     Thyroid: No thyroid mass or thyromegaly.     Vascular: No carotid bruit.     Trachea: Trachea normal.  Cardiovascular:     Rate and Rhythm: Normal rate and regular rhythm.     Pulses: Normal pulses.     Heart sounds: Normal heart sounds, S1 normal and S2 normal. No murmur. No friction rub. No gallop.   Pulmonary:     Effort: Pulmonary effort is normal. No tachypnea or respiratory distress.     Breath sounds: Normal breath sounds. No decreased breath sounds, wheezing, rhonchi or rales.  Abdominal:     General: Bowel sounds are normal.     Palpations: Abdomen is soft.     Tenderness: There is no abdominal tenderness.  Musculoskeletal:     Cervical back: Normal range of motion and neck supple.  Skin:    General: Skin is warm and dry.     Findings: No rash.  Neurological:     Mental Status: She is alert.  Psychiatric:        Mood and Affect: Mood is not anxious or depressed.        Speech: Speech normal.        Behavior: Behavior normal. Behavior is cooperative.        Thought Content: Thought content normal.        Judgment: Judgment normal.      Diabetic foot exam: Normal inspection No skin breakdown No calluses  Normal DP pulses Normal sensation to light touch and monofilament Nails normal  Assessment and Plan   CKD (chronic kidney disease), symptom management only  GFR > 45.  SGLT2i is a great option for her to protect her  kidneys.  Type 2 diabetes mellitus without complication, without long-term current use of insulin (HCC)  Inadequate control. Pt not eating well but compliant now with meds.  She is hesitant still about metfomrin. She will  Increase farxiga to max at 10 mg daily given no cost or SE issues. Continue glipizide as well.     Eliezer Lofts, MD

## 2019-06-18 LAB — HM DIABETES EYE EXAM

## 2019-06-21 ENCOUNTER — Encounter: Payer: Self-pay | Admitting: Family Medicine

## 2019-06-22 ENCOUNTER — Ambulatory Visit: Payer: BC Managed Care – PPO | Attending: Internal Medicine

## 2019-06-22 DIAGNOSIS — Z23 Encounter for immunization: Secondary | ICD-10-CM | POA: Insufficient documentation

## 2019-06-22 NOTE — Progress Notes (Signed)
   Covid-19 Vaccination Clinic  Name:  Dana Rogers    MRN: [id number] DOB: [birth year]  06/22/2019  Dana Rogers was observed post Covid-19 immunization for 15 minutes without incidence. She was provided with Vaccine Information Sheet and instruction to access the V-Safe system.   Dana Rogers was instructed to call 911 with any severe reactions post vaccine: Marland Kitchen Difficulty breathing  . Swelling of your face and throat  . A fast heartbeat  . A bad rash all over your body  . Dizziness and weakness    Immunizations Administered    Name Date Dose VIS Date Route   Pfizer COVID-19 Vaccine 06/22/2019 10:46 AM 0.3 mL 04/06/2019 Intramuscular   Manufacturer: [employer], [employer]   Lot: [id number]   NDC: [address]-3

## 2019-07-17 ENCOUNTER — Ambulatory Visit: Payer: BC Managed Care – PPO | Attending: Internal Medicine

## 2019-07-17 DIAGNOSIS — Z23 Encounter for immunization: Secondary | ICD-10-CM

## 2019-07-17 NOTE — Progress Notes (Signed)
   Covid-19 Vaccination Clinic  Name:  TERRIONNA BRIDWELL    MRN: [id number] DOB: 08-30-1955  07/17/2019  Ms. Seney was observed post Covid-19 immunization for 15 minutes without incident. She was provided with Vaccine Information Sheet and instruction to access the V-Safe system.   Ms. Verastegui was instructed to call 911 with any severe reactions post vaccine: Marland Kitchen Difficulty breathing  . Swelling of face and throat  . A fast heartbeat  . A bad rash all over body  . Dizziness and weakness   Immunizations Administered    Name Date Dose VIS Date Route   Pfizer COVID-19 Vaccine 07/17/2019  3:51 PM 0.3 mL 04/06/2019 Intramuscular   Manufacturer: [employer], [employer]   Lot: [id number]   NDC: [address]-4

## 2019-08-10 ENCOUNTER — Other Ambulatory Visit: Payer: Self-pay | Admitting: Cardiology

## 2019-09-04 ENCOUNTER — Other Ambulatory Visit: Payer: Self-pay | Admitting: Cardiology

## 2019-09-28 ENCOUNTER — Telehealth: Payer: Self-pay | Admitting: Cardiology

## 2019-09-28 NOTE — Telephone Encounter (Signed)
Patient called because she wanted to check on who prescribed to there a medication.

## 2019-10-02 ENCOUNTER — Telehealth: Payer: Self-pay | Admitting: Family Medicine

## 2019-10-02 ENCOUNTER — Other Ambulatory Visit: Payer: Self-pay

## 2019-10-02 ENCOUNTER — Other Ambulatory Visit: Payer: BC Managed Care – PPO

## 2019-10-02 DIAGNOSIS — E119 Type 2 diabetes mellitus without complications: Secondary | ICD-10-CM

## 2019-10-02 DIAGNOSIS — E785 Hyperlipidemia, unspecified: Secondary | ICD-10-CM

## 2019-10-02 NOTE — Telephone Encounter (Signed)
-----   Message from Aquilla Solian, RT sent at 09/18/2019  2:16 PM EDT ----- Regarding: Lab Orders for Tuesday 6.8.2021 Please place lab orders for Tuesday 6.8.2021, office visit for physical on Tuesday 6.15.2021 Thank you, Jones Bales RT(R)

## 2019-10-09 ENCOUNTER — Encounter: Payer: 59 | Admitting: Family Medicine

## 2019-11-22 ENCOUNTER — Other Ambulatory Visit: Payer: Self-pay | Admitting: Family Medicine

## 2019-12-26 ENCOUNTER — Other Ambulatory Visit: Payer: Self-pay | Admitting: Family Medicine

## 2019-12-26 DIAGNOSIS — E119 Type 2 diabetes mellitus without complications: Secondary | ICD-10-CM

## 2020-01-16 ENCOUNTER — Other Ambulatory Visit: Payer: Self-pay

## 2020-01-16 NOTE — Telephone Encounter (Signed)
Pt last seen in 2019, last refill was in May 2021 for 7 tabs and was told needs ov but pt has not been seen. Should she receive any more. Please advise.

## 2020-01-22 ENCOUNTER — Other Ambulatory Visit: Payer: Self-pay

## 2020-01-22 MED ORDER — AMLODIPINE BESYLATE 10 MG PO TABS: 10.0000 mg | ORAL_TABLET | Freq: Every day | ORAL | 0 refills | Status: DC

## 2020-02-10 ENCOUNTER — Other Ambulatory Visit: Payer: Self-pay | Admitting: Family Medicine

## 2020-02-26 ENCOUNTER — Telehealth: Payer: Self-pay | Admitting: Family Medicine

## 2020-02-26 NOTE — Telephone Encounter (Signed)
Please schedule CPE with fasting labs prior.

## 2020-02-29 NOTE — Telephone Encounter (Signed)
Labs 11/12 cpx 11/16

## 2020-03-07 ENCOUNTER — Other Ambulatory Visit (INDEPENDENT_AMBULATORY_CARE_PROVIDER_SITE_OTHER): Payer: 59

## 2020-03-07 ENCOUNTER — Other Ambulatory Visit: Payer: Self-pay

## 2020-03-07 DIAGNOSIS — E119 Type 2 diabetes mellitus without complications: Secondary | ICD-10-CM

## 2020-03-07 LAB — LIPID PANEL
Cholesterol: 122 mg/dL (ref 0–200)
HDL: 43.2 mg/dL (ref >39.00)
LDL Cholesterol: 65 mg/dL (ref 0–99)
NonHDL: 78.35
Total CHOL/HDL Ratio: 3
Triglycerides: 67 mg/dL (ref 0.0–149.0)
VLDL: 13.4 mg/dL (ref 0.0–40.0)

## 2020-03-07 LAB — COMPREHENSIVE METABOLIC PANEL
ALT: 15 U/L (ref 0–35)
AST: 18 U/L (ref 0–37)
Albumin: 4.5 g/dL (ref 3.5–5.2)
Alkaline Phosphatase: 73 U/L (ref 39–117)
BUN: 17 mg/dL (ref 6–23)
CO2: 30 mEq/L (ref 19–32)
Calcium: 9.7 mg/dL (ref 8.4–10.5)
Chloride: 103 mEq/L (ref 96–112)
Creatinine, Ser: 1.09 mg/dL (ref 0.40–1.20)
GFR: 53.34 mL/min — ABNORMAL LOW (ref >60.00)
Glucose, Bld: 134 mg/dL — ABNORMAL HIGH (ref 70–99)
Potassium: 3.5 mEq/L (ref 3.5–5.1)
Sodium: 140 mEq/L (ref 135–145)
Total Bilirubin: 1.1 mg/dL (ref 0.2–1.2)
Total Protein: 7.5 g/dL (ref 6.0–8.3)

## 2020-03-07 LAB — HEMOGLOBIN A1C: Hgb A1c MFr Bld: 7.5 % — ABNORMAL HIGH (ref 4.6–6.5)

## 2020-03-07 NOTE — Progress Notes (Signed)
No critical labs need to be addressed urgently. We will discuss labs in detail at upcoming office visit.   

## 2020-03-09 ENCOUNTER — Other Ambulatory Visit: Payer: Self-pay | Admitting: Family Medicine

## 2020-03-11 ENCOUNTER — Ambulatory Visit (INDEPENDENT_AMBULATORY_CARE_PROVIDER_SITE_OTHER): Payer: 59 | Admitting: Family Medicine

## 2020-03-11 ENCOUNTER — Other Ambulatory Visit: Payer: Self-pay

## 2020-03-11 ENCOUNTER — Encounter: Payer: Self-pay | Admitting: Family Medicine

## 2020-03-11 VITALS — BP 122/80 | HR 62 | Temp 97.6°F | Ht 65.5 in | Wt 163.2 lb

## 2020-03-11 DIAGNOSIS — N183 Chronic kidney disease, stage 3 unspecified: Secondary | ICD-10-CM | POA: Diagnosis not present

## 2020-03-11 DIAGNOSIS — Z23 Encounter for immunization: Secondary | ICD-10-CM

## 2020-03-11 DIAGNOSIS — E2839 Other primary ovarian failure: Secondary | ICD-10-CM

## 2020-03-11 DIAGNOSIS — Z0000 Encounter for general adult medical examination without abnormal findings: Secondary | ICD-10-CM

## 2020-03-11 DIAGNOSIS — I152 Hypertension secondary to endocrine disorders: Secondary | ICD-10-CM

## 2020-03-11 DIAGNOSIS — Z1231 Encounter for screening mammogram for malignant neoplasm of breast: Secondary | ICD-10-CM

## 2020-03-11 DIAGNOSIS — E1159 Type 2 diabetes mellitus with other circulatory complications: Secondary | ICD-10-CM | POA: Diagnosis not present

## 2020-03-11 DIAGNOSIS — E1169 Type 2 diabetes mellitus with other specified complication: Secondary | ICD-10-CM

## 2020-03-11 DIAGNOSIS — E785 Hyperlipidemia, unspecified: Secondary | ICD-10-CM

## 2020-03-11 LAB — HM DIABETES FOOT EXAM

## 2020-03-11 MED ORDER — DAPAGLIFLOZIN PROPANEDIOL 10 MG PO TABS
10.0000 mg | ORAL_TABLET | Freq: Every day | ORAL | 3 refills | Status: DC
Start: 2020-03-11 — End: 2020-11-14

## 2020-03-11 MED ORDER — AMLODIPINE BESYLATE 10 MG PO TABS
10.0000 mg | ORAL_TABLET | Freq: Every day | ORAL | 3 refills | Status: DC
Start: 2020-03-11 — End: 2020-11-14

## 2020-03-11 NOTE — Assessment & Plan Note (Signed)
LDL at goal on statin. 

## 2020-03-11 NOTE — Assessment & Plan Note (Signed)
Great control on amlodipine, HCTZ and losartan.

## 2020-03-11 NOTE — Progress Notes (Signed)
Chief Complaint  Patient presents with  . Annual Exam    History of Present Illness: HPI The patient is here for annual wellness exam and preventative care.    Hypertension:   Great control on amlodipine, HCTZ and losartan.  BP Readings from Last 3 Encounters:  03/11/20 122/80  05/29/19 118/74  02/22/19 130/80  Using medication without problems or lightheadedness:  none Chest pain with exertion: none Edema: occ  In left ankle, small tingling spot, no rash Short of breath: none Average home BPs: Other issues:   Has lost 6 lbs in last year Wt Readings from Last 3 Encounters:  03/11/20 163 lb 4 oz (74 kg)  05/29/19 168 lb 8 oz (76.4 kg)  02/22/19 169 lb 8 oz (76.9 kg)     Elevated Cholesterol:  On atorvastatin Lab Results  Component Value Date   CHOL 122 03/07/2020   HDL 43.20 03/07/2020   LDLCALC 65 03/07/2020   TRIG 67.0 03/07/2020   CHOLHDL 3 03/07/2020  Diet compliance: moderate Exercise: walking occ.. active at work Other complaints:  Diabetes: Tolerating higher dose of Farxiga.   Lab Results  Component Value Date   HGBA1C 7.5 (H) 03/07/2020  Using medications without difficulties: Hypoglycemic episodes: Hyperglycemic episodes: Feet problems: no ulcer Blood Sugars averaging: not recently eye exam within last year: 05/2019 CKD stable GFR   This visit occurred during the SARS-CoV-2 public health emergency.  Safety protocols were in place, including screening questions prior to the visit, additional usage of staff PPE, and extensive cleaning of exam room while observing appropriate contact time as indicated for disinfecting solutions.   COVID 19 screen:  No recent travel or known exposure to COVID19 The patient denies respiratory symptoms of COVID 19 at this time. The importance of social distancing was discussed today.     Review of Systems  Constitutional: Negative for chills and fever.  HENT: Negative for congestion and ear pain.   Eyes: Negative for  pain and redness.  Respiratory: Negative for cough and shortness of breath.   Cardiovascular: Negative for chest pain, palpitations and leg swelling.  Gastrointestinal: Negative for abdominal pain, blood in stool, constipation, diarrhea, nausea and vomiting.  Genitourinary: Negative for dysuria.  Musculoskeletal: Negative for falls and myalgias.  Skin: Negative for rash.  Neurological: Negative for dizziness.  Psychiatric/Behavioral: Negative for depression. The patient is not nervous/anxious.       Past Medical History:  Diagnosis Date  . Diabetes mellitus   . Hyperlipidemia   . Hypertension   . Peripheral edema   . Rosacea     reports that she has never smoked. She has never used smokeless tobacco. She reports that she does not drink alcohol and does not use drugs.   Current Outpatient Medications:  .  amLODipine (NORVASC) 10 MG tablet, Take 1 tablet (10 mg total) by mouth daily. NEED OV., Disp: 7 tablet, Rfl: 0 .  atorvastatin (LIPITOR) 20 MG tablet, TAKE 1 TABLET (20 MG TOTAL) BY MOUTH DAILY., Disp: 90 tablet, Rfl: 3 .  Blood Glucose Monitoring Suppl (ONE TOUCH ULTRA 2) w/Device KIT, Ck blood sugar twice a day and as directed. Dx E11.65, Disp: 1 each, Rfl: 0 .  cyclobenzaprine (FLEXERIL) 10 MG tablet, Take 1 tablet (10 mg total) by mouth at bedtime as needed for muscle spasms., Disp: 15 tablet, Rfl: 0 .  dapagliflozin propanediol (FARXIGA) 10 MG TABS tablet, Take 10 mg by mouth daily before breakfast., Disp: 30 tablet, Rfl: 11 .  glucose blood (ONE   TOUCH ULTRA TEST) test strip, Ck blood sugar twice a day and as directed.Dx E11.65, Disp: 100 each, Rfl: 3 .  hydrochlorothiazide (HYDRODIURIL) 25 MG tablet, Take 1 tablet (25 mg total) by mouth daily., Disp: 90 tablet, Rfl: 1 .  losartan (COZAAR) 100 MG tablet, Take 1 tablet (100 mg total) by mouth daily., Disp: 90 tablet, Rfl: 1 .  metFORMIN (GLUCOPHAGE-XR) 500 MG 24 hr tablet, TAKE 2 TABLETS BY MOUTH EVERY DAY WITH BREAKFAST OR CAN  SPLIT DOSE IF NEEDED, Disp: 60 tablet, Rfl: 0 .  ONETOUCH DELICA LANCETS FINE MISC, Ck blood sugar twice a day and as directed. Dx E11.65, Disp: 100 each, Rfl: 3   Observations/Objective: Blood pressure 122/80, pulse 62, temperature 97.6 F (36.4 C), temperature source Temporal, height 5' 5.5" (1.664 m), weight 163 lb 4 oz (74 kg), SpO2 95 %.   Physical Exam Constitutional:      General: She is not in acute distress.    Appearance: Normal appearance. She is well-developed. She is not ill-appearing or toxic-appearing.  HENT:     Head: Normocephalic.     Right Ear: Hearing, tympanic membrane, ear canal and external ear normal.     Left Ear: Hearing, tympanic membrane, ear canal and external ear normal.     Nose: Nose normal.  Eyes:     General: Lids are normal. Lids are everted, no foreign bodies appreciated.     Conjunctiva/sclera: Conjunctivae normal.     Pupils: Pupils are equal, round, and reactive to light.  Neck:     Thyroid: No thyroid mass or thyromegaly.     Vascular: No carotid bruit.     Trachea: Trachea normal.  Cardiovascular:     Rate and Rhythm: Normal rate and regular rhythm.     Heart sounds: Normal heart sounds, S1 normal and S2 normal. No murmur heard.  No gallop.   Pulmonary:     Effort: Pulmonary effort is normal. No respiratory distress.     Breath sounds: Normal breath sounds. No wheezing, rhonchi or rales.  Abdominal:     General: Bowel sounds are normal. There is no distension or abdominal bruit.     Palpations: Abdomen is soft. There is no fluid wave or mass.     Tenderness: There is no abdominal tenderness. There is no guarding or rebound.     Hernia: No hernia is present.  Musculoskeletal:     Cervical back: Normal range of motion and neck supple.  Lymphadenopathy:     Cervical: No cervical adenopathy.  Skin:    General: Skin is warm and dry.     Findings: No rash.  Neurological:     Mental Status: She is alert.     Cranial Nerves: No cranial  nerve deficit.     Sensory: No sensory deficit.  Psychiatric:        Mood and Affect: Mood is not anxious or depressed.        Speech: Speech normal.        Behavior: Behavior normal. Behavior is cooperative.        Judgment: Judgment normal.      Diabetic foot exam: Normal inspection No skin breakdown Significant calluses  On great toes bialterally Normal DP pulses Normal sensation to light touch and monofilament Nails  thickened   Assessment and Plan   The patient's preventative maintenance and recommended screening tests for an annual wellness exam were reviewed in full today. Brought up to date unless services declined.  Counselled on   the importance of diet, exercise, and its role in overall health and mortality. The patient's FH and SH was reviewed, including their home life, tobacco status, and drug and alcohol status.   Last PAP nml 2010, 2012..high risk HPV 05/2012, high risk HPV 08/2013,And again in 2016 .. Referred to Dr. Neal GYN, neg colposcopy..  2018 neg pap, neg HPV.. Repeat in 2021.. she will plan to come back for pap in 3 months. Mammo 2020.. repeat due 03/2020  Colon cancer screening: Could not tolerate golytely in past, unable to do colonoscopy 2010. Plan  COloguard once has Medicare in Dec. Vaccines up to date with Tdap, given flu and PNA  today.  Due for shingles.  DEXA: no early osteo in family.. Plan start 64.  DUE Hep C screeningdone HIV:done  Diabetes mellitus with circulatory complication, Hypertension (HCC) Improving control on higher dose of farxiga. Continue current regimen. Encouraged exercise, weight loss, healthy eating habits.   Hyperlipidemia associated with type 2 diabetes mellitus (HCC) LDL at goal on statin.  Hypertension associated with diabetes (HCC) Great control on amlodipine, HCTZ and losartan.   CKD (chronic kidney disease), symptom management only Stable on  ARB    Amy Bedsole, MD   

## 2020-03-11 NOTE — Assessment & Plan Note (Signed)
Improving control on higher dose of farxiga. Continue current regimen. Encouraged exercise, weight loss, healthy eating habits.

## 2020-03-11 NOTE — Assessment & Plan Note (Signed)
Stable on ARB 

## 2020-03-11 NOTE — Patient Instructions (Addendum)
Keep up working on healthy low carb diet and start regular exercise.  Consider COVID booster dose.  We will set you up with Cologuard in Dec..please send a Mychart message with a copy of your Medicare card.  Please call the location of your choice from the menu below to schedule your Mammogram and/or Bone Density appointment.    [place]   1. [facility]                      Phone:  [phone] [address]                               [place], [place] [address]                                                             Services: Traditional and 3D Mammogram, Bone Density   2. [facility]                 Phone: [phone] [address]                                                       [place], [place] [address]    Service: Bone Density ONLY   *this site does NOT perform mammograms  3. [facility] [place]                        Phone:  [phone] [address]                                  [place], [place] [address]                                            Services:  3D Mammogram and Bone Density    [place]  1. [facility]   Phone:  [phone]   [address]                                                                            [place], [place] [address]                                            Services: 3D Mammogram and Bone Density  2. [facility])  Phone:  [phone]   [address]  [address]                        [place], [place] [address]                                              Services:  3D Mammogram and Bone Density

## 2020-03-11 NOTE — Addendum Note (Signed)
Addended by: Damita Lack on: 03/11/2020 09:44 AM   Modules accepted: Orders

## 2020-05-19 ENCOUNTER — Other Ambulatory Visit: Payer: Self-pay | Admitting: Family Medicine

## 2020-05-19 DIAGNOSIS — E2839 Other primary ovarian failure: Secondary | ICD-10-CM

## 2020-05-19 DIAGNOSIS — Z1231 Encounter for screening mammogram for malignant neoplasm of breast: Secondary | ICD-10-CM

## 2020-06-10 ENCOUNTER — Ambulatory Visit: Payer: 59 | Admitting: Family Medicine

## 2020-06-10 DIAGNOSIS — Z0289 Encounter for other administrative examinations: Secondary | ICD-10-CM

## 2020-06-13 ENCOUNTER — Other Ambulatory Visit: Payer: Self-pay | Admitting: Family Medicine

## 2020-11-01 ENCOUNTER — Other Ambulatory Visit: Payer: Self-pay | Admitting: Family Medicine

## 2020-11-14 ENCOUNTER — Telehealth: Payer: Self-pay

## 2020-11-14 MED ORDER — LOSARTAN POTASSIUM 100 MG PO TABS: 100.0000 mg | ORAL_TABLET | Freq: Every day | ORAL | 0 refills | Status: DC

## 2020-11-14 MED ORDER — AMLODIPINE BESYLATE 10 MG PO TABS: 10.0000 mg | ORAL_TABLET | Freq: Every day | ORAL | 0 refills | Status: DC

## 2020-11-14 MED ORDER — HYDROCHLOROTHIAZIDE 25 MG PO TABS: 25.0000 mg | ORAL_TABLET | Freq: Every day | ORAL | 0 refills | Status: DC

## 2020-11-14 MED ORDER — ATORVASTATIN CALCIUM 20 MG PO TABS: 20.0000 mg | ORAL_TABLET | Freq: Every day | ORAL | 0 refills | Status: DC

## 2020-11-14 MED ORDER — DAPAGLIFLOZIN PROPANEDIOL 10 MG PO TABS: 10.0000 mg | ORAL_TABLET | Freq: Every day | ORAL | 0 refills | Status: DC

## 2020-11-14 NOTE — Telephone Encounter (Signed)
Refills sent as requested to [facility] [place].

## 2020-11-14 NOTE — Telephone Encounter (Signed)
Pt has appt 12-11-20. Needs refills on amlodipine, atorvastatin, Farxiga, losartan, and HCTZ. She has been out of medications for several weeks. [facility] [place].

## 2020-12-11 ENCOUNTER — Other Ambulatory Visit: Payer: Self-pay

## 2020-12-11 ENCOUNTER — Encounter: Payer: Self-pay | Admitting: Family Medicine

## 2020-12-11 ENCOUNTER — Ambulatory Visit (INDEPENDENT_AMBULATORY_CARE_PROVIDER_SITE_OTHER): Payer: 59 | Admitting: Family Medicine

## 2020-12-11 VITALS — BP 130/80 | HR 58 | Temp 98.2°F | Ht 65.5 in | Wt 162.5 lb

## 2020-12-11 DIAGNOSIS — I152 Hypertension secondary to endocrine disorders: Secondary | ICD-10-CM

## 2020-12-11 DIAGNOSIS — R87810 Cervical high risk human papillomavirus (HPV) DNA test positive: Secondary | ICD-10-CM

## 2020-12-11 DIAGNOSIS — E1159 Type 2 diabetes mellitus with other circulatory complications: Secondary | ICD-10-CM | POA: Diagnosis not present

## 2020-12-11 DIAGNOSIS — Z124 Encounter for screening for malignant neoplasm of cervix: Secondary | ICD-10-CM | POA: Diagnosis not present

## 2020-12-11 LAB — POCT GLYCOSYLATED HEMOGLOBIN (HGB A1C): Hemoglobin A1C: 8 % — AB (ref 4.0–5.6)

## 2020-12-11 MED ORDER — GLUCOSE BLOOD VI STRP: ORAL_STRIP | 3 refills | Status: AC

## 2020-12-11 MED ORDER — ONETOUCH ULTRASOFT LANCETS MISC: 3 refills | Status: AC

## 2020-12-11 MED ORDER — ONETOUCH ULTRA 2 W/DEVICE KIT: PACK | 0 refills | Status: DC

## 2020-12-11 NOTE — Progress Notes (Signed)
Patient ID: Dana Rogers, female    DOB: 25-Jul-1955, 65 y.o.   MRN: [id number]  This visit was conducted in person.  BP 130/80   Pulse (!) 58   Temp 98.2 F (36.8 C) (Temporal)   Ht 5' 5.5" (1.664 m)   Wt 162 lb 8 oz (73.7 kg)   SpO2 96%   BMI 26.63 kg/m    CC: Chief Complaint  Patient presents with   Diabetes   Gynecologic Exam    Pap Smear    Subjective:   HPI: Dana Rogers is a 65 y.o. female presenting on 12/11/2020 for Diabetes and Gynecologic Exam (Pap Smear)  Diabetes:  Worsened control of A1C.Marland Kitchen up from 7.5 Lab Results  Component Value Date   HGBA1C 8.0 (A) 12/11/2020  Using medications without difficulties:  On metformin XR 500 mg 2 tabs daily  Farxiga 10 mg daily   She has not been taking  these meds regularly... she feels both are making her nauseous.  Hypoglycemic episodes: Hyperglycemic episodes: Feet problems: no ulcer, occ tingling in right inner ankle, swelling at times, occ cold.. toes not cold,  no claudication symptoms. Blood Sugars averaging: eye exam within last year: due   She is active on feet daily.   Wt Readings from Last 3 Encounters:  12/11/20 162 lb 8 oz (73.7 kg)  03/11/20 163 lb 4 oz (74 kg)  05/29/19 168 lb 8 oz (76.4 kg)     02/2020 GFR 53    BP well controlled on amlodipine 10 mg daily/HCTZ, losartan 100 mg daily BP Readings from Last 3 Encounters:  12/11/20 130/80  03/11/20 122/80  05/29/19 118/74   Neg PAP, neg HPV in 2018   High risk HPV history in 2016, negative colposcopy... she wishes to do pap at a later time.  Relevant past medical, surgical, family and social history reviewed and updated as indicated. Interim medical history since our last visit reviewed. Allergies and medications reviewed and updated. Outpatient Medications Prior to Visit  Medication Sig Dispense Refill   amLODipine (NORVASC) 10 MG tablet Take 1 tablet (10 mg total) by mouth daily. 90 tablet 0   atorvastatin (LIPITOR) 20 MG tablet  Take 1 tablet (20 mg total) by mouth daily. 90 tablet 0   cyclobenzaprine (FLEXERIL) 10 MG tablet Take 1 tablet (10 mg total) by mouth at bedtime as needed for muscle spasms. 15 tablet 0   dapagliflozin propanediol (FARXIGA) 10 MG TABS tablet Take 1 tablet (10 mg total) by mouth daily before breakfast. 90 tablet 0   hydrochlorothiazide (HYDRODIURIL) 25 MG tablet Take 1 tablet (25 mg total) by mouth daily. 90 tablet 0   losartan (COZAAR) 100 MG tablet Take 1 tablet (100 mg total) by mouth daily. 90 tablet 0   metFORMIN (GLUCOPHAGE-XR) 500 MG 24 hr tablet TAKE 2 TABLETS BY MOUTH EVERY DAY WITH BREAKFAST OR CAN SPLIT DOSE IF NEEDED 60 tablet 0   Blood Glucose Monitoring Suppl (ONE TOUCH ULTRA 2) w/Device KIT Ck blood sugar twice a day and as directed. Dx E11.65 1 each 0   glucose blood (ONE TOUCH ULTRA TEST) test strip Ck blood sugar twice a day and as directed.Dx E11.65 100 each 3   ONETOUCH DELICA LANCETS FINE MISC Ck blood sugar twice a day and as directed. Dx E11.65 100 each 3   No facility-administered medications prior to visit.     Per HPI unless specifically indicated in ROS section below Review of Systems  Constitutional:  Negative for fatigue and fever.  HENT:  Negative for congestion and ear pain.   Eyes:  Negative for pain.  Respiratory:  Negative for cough, chest tightness and shortness of breath.   Cardiovascular:  Negative for chest pain, palpitations and leg swelling.  Gastrointestinal:  Negative for abdominal pain.  Genitourinary:  Negative for dysuria and vaginal bleeding.  Musculoskeletal:  Negative for back pain.  Neurological:  Negative for syncope, light-headedness and headaches.  Psychiatric/Behavioral:  Negative for dysphoric mood.   Objective:  BP 130/80   Pulse (!) 58   Temp 98.2 F (36.8 C) (Temporal)   Ht 5' 5.5" (1.664 m)   Wt 162 lb 8 oz (73.7 kg)   SpO2 96%   BMI 26.63 kg/m   Wt Readings from Last 3 Encounters:  12/11/20 162 lb 8 oz (73.7 kg)  03/11/20  163 lb 4 oz (74 kg)  05/29/19 168 lb 8 oz (76.4 kg)      Physical Exam Constitutional:      General: She is not in acute distress.    Appearance: Normal appearance. She is well-developed. She is not ill-appearing or toxic-appearing.  HENT:     Head: Normocephalic.     Right Ear: Hearing, tympanic membrane, ear canal and external ear normal. Tympanic membrane is not erythematous, retracted or bulging.     Left Ear: Hearing, tympanic membrane, ear canal and external ear normal. Tympanic membrane is not erythematous, retracted or bulging.     Nose: No mucosal edema or rhinorrhea.     Right Sinus: No maxillary sinus tenderness or frontal sinus tenderness.     Left Sinus: No maxillary sinus tenderness or frontal sinus tenderness.     Mouth/Throat:     Pharynx: Uvula midline.  Eyes:     General: Lids are normal. Lids are everted, no foreign bodies appreciated.     Conjunctiva/sclera: Conjunctivae normal.     Pupils: Pupils are equal, round, and reactive to light.  Neck:     Thyroid: No thyroid mass or thyromegaly.     Vascular: No carotid bruit.     Trachea: Trachea normal.  Cardiovascular:     Rate and Rhythm: Normal rate and regular rhythm.     Pulses: Normal pulses.     Heart sounds: Normal heart sounds, S1 normal and S2 normal. No murmur heard.   No friction rub. No gallop.  Pulmonary:     Effort: Pulmonary effort is normal. No tachypnea or respiratory distress.     Breath sounds: Normal breath sounds. No decreased breath sounds, wheezing, rhonchi or rales.  Abdominal:     General: Bowel sounds are normal.     Palpations: Abdomen is soft.     Tenderness: There is no abdominal tenderness.  Musculoskeletal:     Cervical back: Normal range of motion and neck supple.  Skin:    General: Skin is warm and dry.     Findings: No rash.  Neurological:     Mental Status: She is alert.  Psychiatric:        Mood and Affect: Mood is not anxious or depressed.        Speech: Speech normal.         Behavior: Behavior normal. Behavior is cooperative.        Thought Content: Thought content normal.        Judgment: Judgment normal.      Results for orders placed or performed in visit on 12/11/20  POCT glycosylated hemoglobin (Hb A1C)  Result Value Ref Range   Hemoglobin A1C 8.0 (A) 4.0 - 5.6 %   HbA1c POC (<> result, manual entry)     HbA1c, POC (prediabetic range)     HbA1c, POC (controlled diabetic range)      This visit occurred during the SARS-CoV-2 public health emergency.  Safety protocols were in place, including screening questions prior to the visit, additional usage of staff PPE, and extensive cleaning of exam room while observing appropriate contact time as indicated for disinfecting solutions.   COVID 19 screen:  No recent travel or known exposure to COVID19 The patient denies respiratory symptoms of COVID 19 at this time. The importance of social distancing was discussed today.   Assessment and Plan    Problem List Items Addressed This Visit     Diabetes mellitus with circulatory complication, Hypertension ([place]) - Primary   Relevant Medications   Blood Glucose Monitoring Suppl (ONE TOUCH ULTRA 2) w/Device KIT   glucose blood (ONE TOUCH ULTRA TEST) test strip   Lancets (ONETOUCH ULTRASOFT) lancets   Other Relevant Orders   POCT glycosylated hemoglobin (Hb A1C) (Completed)   Hypertension associated with diabetes (HCC)    Stable, chronic.  Continue current medication.  amlodipine 10 mg daily/HCTZ, losartan 100 mg daily       Pap smear of cervix shows high risk HPV present    Will repeat one more pap to assure normal given history... she will return for CPX in next 3 months.      Other Visit Diagnoses     Cervical cancer screening       Relevant Orders   Cytology - PAP([place])        Eliezer Lofts, MD

## 2020-12-11 NOTE — Addendum Note (Signed)
Addended by: Damita Lack on: 12/11/2020 10:47 AM   Modules accepted: Orders

## 2020-12-11 NOTE — Patient Instructions (Addendum)
Continue at lower dose of metformin 500 mg ONE tablet daily and restart the farxiga.  Stop sweet tea, decrease [employer].  Set up yearly eye exam for diabetes and have the opthalmologist send [place] a copy of the evaluation for the chart.  Marland Kitchen

## 2020-12-11 NOTE — Assessment & Plan Note (Signed)
Worsened control as she stopped her medication given nausea with both.   Will have her continue lower dose metformin and see if nausea with restarting farxiga.  She will stop  Sweet teat and biscuits.  Follow up in 3 months with A1C.

## 2020-12-11 NOTE — Assessment & Plan Note (Signed)
Will repeat one more pap to assure normal given history... she will return for CPX in next 3 months.

## 2020-12-11 NOTE — Assessment & Plan Note (Signed)
Stable, chronic.  Continue current medication.  amlodipine 10 mg daily/HCTZ, losartan 100 mg daily

## 2021-02-09 ENCOUNTER — Other Ambulatory Visit: Payer: Self-pay | Admitting: Family Medicine

## 2021-03-23 ENCOUNTER — Other Ambulatory Visit (INDEPENDENT_AMBULATORY_CARE_PROVIDER_SITE_OTHER): Payer: 59

## 2021-03-23 ENCOUNTER — Telehealth: Payer: Self-pay | Admitting: Family Medicine

## 2021-03-23 ENCOUNTER — Other Ambulatory Visit: Payer: Self-pay

## 2021-03-23 DIAGNOSIS — E1159 Type 2 diabetes mellitus with other circulatory complications: Secondary | ICD-10-CM

## 2021-03-23 LAB — COMPREHENSIVE METABOLIC PANEL
ALT: 14 U/L (ref 0–35)
AST: 17 U/L (ref 0–37)
Albumin: 4.6 g/dL (ref 3.5–5.2)
Alkaline Phosphatase: 71 U/L (ref 39–117)
BUN: 16 mg/dL (ref 6–23)
CO2: 28 mEq/L (ref 19–32)
Calcium: 9.8 mg/dL (ref 8.4–10.5)
Chloride: 102 mEq/L (ref 96–112)
Creatinine, Ser: 1.06 mg/dL (ref 0.40–1.20)
GFR: 54.76 mL/min — ABNORMAL LOW (ref >60.00)
Glucose, Bld: 142 mg/dL — ABNORMAL HIGH (ref 70–99)
Potassium: 3.7 mEq/L (ref 3.5–5.1)
Sodium: 138 mEq/L (ref 135–145)
Total Bilirubin: 0.6 mg/dL (ref 0.2–1.2)
Total Protein: 7.9 g/dL (ref 6.0–8.3)

## 2021-03-23 LAB — LIPID PANEL
Cholesterol: 148 mg/dL (ref 0–200)
HDL: 43 mg/dL (ref >39.00)
LDL Cholesterol: 85 mg/dL (ref 0–99)
NonHDL: 105.07
Total CHOL/HDL Ratio: 3
Triglycerides: 101 mg/dL (ref 0.0–149.0)
VLDL: 20.2 mg/dL (ref 0.0–40.0)

## 2021-03-23 LAB — HEMOGLOBIN A1C: Hgb A1c MFr Bld: 7.3 % — ABNORMAL HIGH (ref 4.6–6.5)

## 2021-03-23 NOTE — Progress Notes (Signed)
No critical labs need to be addressed urgently. We will discuss labs in detail at upcoming office visit.   

## 2021-03-23 NOTE — Telephone Encounter (Signed)
-----   Message from Alvina Chou sent at 03/10/2021 11:41 AM EST ----- Regarding: Lab orders for Monday, 11.28.22  AWV lab orders, please.

## 2021-03-27 ENCOUNTER — Ambulatory Visit (INDEPENDENT_AMBULATORY_CARE_PROVIDER_SITE_OTHER): Payer: 59 | Admitting: Family Medicine

## 2021-03-27 ENCOUNTER — Other Ambulatory Visit: Payer: Self-pay

## 2021-03-27 ENCOUNTER — Encounter: Payer: Self-pay | Admitting: Family Medicine

## 2021-03-27 VITALS — BP 130/78 | HR 64 | Temp 98.2°F | Ht 65.5 in | Wt 160.1 lb

## 2021-03-27 DIAGNOSIS — I10 Essential (primary) hypertension: Secondary | ICD-10-CM

## 2021-03-27 DIAGNOSIS — E1169 Type 2 diabetes mellitus with other specified complication: Secondary | ICD-10-CM

## 2021-03-27 DIAGNOSIS — Z23 Encounter for immunization: Secondary | ICD-10-CM | POA: Diagnosis not present

## 2021-03-27 DIAGNOSIS — E1159 Type 2 diabetes mellitus with other circulatory complications: Secondary | ICD-10-CM | POA: Diagnosis not present

## 2021-03-27 DIAGNOSIS — Z0000 Encounter for general adult medical examination without abnormal findings: Secondary | ICD-10-CM | POA: Diagnosis not present

## 2021-03-27 DIAGNOSIS — I152 Hypertension secondary to endocrine disorders: Secondary | ICD-10-CM

## 2021-03-27 DIAGNOSIS — Z1211 Encounter for screening for malignant neoplasm of colon: Secondary | ICD-10-CM

## 2021-03-27 DIAGNOSIS — E785 Hyperlipidemia, unspecified: Secondary | ICD-10-CM

## 2021-03-27 DIAGNOSIS — E2839 Other primary ovarian failure: Secondary | ICD-10-CM

## 2021-03-27 LAB — HM DIABETES FOOT EXAM

## 2021-03-27 NOTE — Assessment & Plan Note (Signed)
Stable, chronic.  Continue current medication.    farxiga 10 mg... not really taking metfomrin except rarely given nausea.

## 2021-03-27 NOTE — Addendum Note (Signed)
Addended by: Damita Lack on: 03/27/2021 09:24 AM   Modules accepted: Orders

## 2021-03-27 NOTE — Assessment & Plan Note (Signed)
Stable, chronic.  Continue current medication. ° ° ° atorvastatin 20 mg daily °

## 2021-03-27 NOTE — Patient Instructions (Addendum)
Add exercise to regimen to lower the blood sugar to A1`C  <7. Set up yearly eye exam for diabetes and have the opthalmologist send [place] a copy of the evaluation for the chart.  Please call the location of your choice from the menu below to schedule your Mammogram and/or Bone Density appointment.    [facility]   [facility]                      Phone:  [phone] [address]                               [place], [place] [address]                                                             Services: Traditional and 3D Mammogram, Bone Density   [facility]                 Phone: [phone] [address]                                                       [place], [place] [address]    Service: Bone Density ONLY   *this site does NOT perform mammograms  [facility]                        Phone:  [phone] [address]                                  [place], [place] [address]                                            Services:  3D Mammogram and Bone Density

## 2021-03-27 NOTE — Progress Notes (Signed)
Patient ID: Dana Rogers, female    DOB: 1955-06-25, 65 y.o.   MRN: [id number]  This visit was conducted in person.  BP 130/78   Pulse 64   Temp 98.2 F (36.8 C) (Temporal)   Ht 5' 5.5" (1.664 m)   Wt 160 lb 2 oz (72.6 kg)   SpO2 96%   BMI 26.24 kg/m    CC: Chief Complaint  Patient presents with   Welcome to Medicare    Subjective:   HPI: Dana Rogers is a 65 y.o. female presenting on 03/27/2021 for Welcome to Medicare and review of chronic health problems. He/She also has the following acute concerns today:  2 weeks ago... had nasal congestion... progressed to bloody greenish mucus, mild cough ... turned the corner.  No fever. No SOB.  Still has some sore throat, constant.  Has been using throat lozanges. No face pain or ear pain.  I have personally reviewed the Medicare Annual Wellness questionnaire and have noted 1. The patient's medical and social history 2. Their use of alcohol, tobacco or illicit drugs 3. Their current medications and supplements 4. The patient's functional ability including ADL's, fall risks, home safety risks and hearing or visual             impairment. 5. Diet and physical activities 6. Evidence for depression or mood disorders 7.         Updated provider list Cognitive evaluation was performed and recorded on pt medicare questionnaire form. The patients weight, height, BMI and visual acuity have been recorded in the chart   I have made referrals, counseling and provided education to the patient based review of the above and I have provided the pt with a written personalized care plan for preventive services.   Documentation of this information was scanned into the electronic record under the media tab.   Advance directives and end of life planning reviewed in detail with patient and documented in EMR. Patient given handout on advance care directives if needed. HCPOA and living will updated if needed.  Hearing Screening  Method:  Audiometry   _0  _1  _2  _3   Right ear _4 Left ear _5 Vision Screening   Right eye Left eye Both eyes  Without correction _6  With correction       Fall Risk 02/25/2018 03/11/2020 03/27/2021  Falls in the past year? - 0 0  Patient Fall Risk Level Low fall risk - -     [place] Office Visit from 03/27/2021 in [place] at [facility] Total Score 0      Hypertension:   Good control on amlodipine , HCTZ and losartan. BP Readings from Last 3 Encounters:  03/27/21 130/78  12/11/20 130/80  03/11/20 122/80  Using medication without problems or lightheadedness:  none Chest pain with exertion: none Edema: occ Short of breath: none Average home BPs: 130/70 Other issues:  Elevated Cholesterol:LDL at goal < 100 on atorvastatin 20 mg daily Lab Results  Component Value Date   CHOL 148 03/23/2021   HDL 43.00 03/23/2021   LDLCALC 85 03/23/2021   TRIG 101.0 03/23/2021   CHOLHDL 3 03/23/2021  Using medications without problems:none Muscle aches:  none Diet compliance: low carb, decreased sweet tea. Exercise:  none Other complaints:  Diabetes:   Improved control on Farxiga 10 mg,  but she forgets the metformin 1000 mg XR frequently ( was having nausea with  it) Lab Results  Component Value Date   HGBA1C 7.3 (H) 03/23/2021  Using medications without difficulties: Hypoglycemic episodes: Hyperglycemic episodes: Feet problems: on ulcers Blood Sugars averaging: not checking regularly eye exam within last year:  Wt Readings from Last 3 Encounters:  03/27/21 160 lb 2 oz (72.6 kg)  12/11/20 162 lb 8 oz (73.7 kg)  03/11/20 163 lb 4 oz (74 kg)   Body mass index is 26.24 kg/m.        Relevant past medical, surgical, family and social history reviewed and updated as indicated. Interim medical history since our last visit reviewed. Allergies and medications reviewed and updated. Outpatient Medications Prior to  Visit  Medication Sig Dispense Refill   amLODipine (NORVASC) 10 MG tablet Take 1 tablet (10 mg total) by mouth daily. 90 tablet 0   atorvastatin (LIPITOR) 20 MG tablet TAKE 1 TABLET BY MOUTH EVERY DAY 90 tablet 0   Blood Glucose Monitoring Suppl (ONE TOUCH ULTRA 2) w/Device KIT Check blood sugar twice a day and as directed. Dx E11.65 1 kit 0   cyclobenzaprine (FLEXERIL) 10 MG tablet Take 1 tablet (10 mg total) by mouth at bedtime as needed for muscle spasms. 15 tablet 0   dapagliflozin propanediol (FARXIGA) 10 MG TABS tablet Take 1 tablet (10 mg total) by mouth daily before breakfast. 90 tablet 0   glucose blood (ONE TOUCH ULTRA TEST) test strip Check blood sugar twice a day and as directed.Dx E11.65 100 each 3   hydrochlorothiazide (HYDRODIURIL) 25 MG tablet TAKE 1 TABLET (25 MG TOTAL) BY MOUTH DAILY. 90 tablet 0   Lancets (ONETOUCH ULTRASOFT) lancets Check blood sugar twice a day and as directed. Dx E11.65 100 each 3   losartan (COZAAR) 100 MG tablet TAKE 1 TABLET BY MOUTH EVERY DAY 90 tablet 0   metFORMIN (GLUCOPHAGE-XR) 500 MG 24 hr tablet TAKE 2 TABLETS BY MOUTH EVERY DAY WITH BREAKFAST OR CAN SPLIT DOSE IF NEEDED 60 tablet 0   No facility-administered medications prior to visit.     Per HPI unless specifically indicated in ROS section below Review of Systems Objective:  BP 130/78   Pulse 64   Temp 98.2 F (36.8 C) (Temporal)   Ht 5' 5.5" (1.664 m)   Wt 160 lb 2 oz (72.6 kg)   SpO2 96%   BMI 26.24 kg/m   Wt Readings from Last 3 Encounters:  03/27/21 160 lb 2 oz (72.6 kg)  12/11/20 162 lb 8 oz (73.7 kg)  03/11/20 163 lb 4 oz (74 kg)      Physical Exam  Diabetic foot exam: Normal inspection No skin breakdown No calluses  Normal DP pulses Normal sensation to light touch and monofilament Nails normal     Results for orders placed or performed in visit on 03/23/21  Comprehensive metabolic panel  Result Value Ref Range   Sodium 138 135 - 145 mEq/L   Potassium 3.7 3.5 -  5.1 mEq/L   Chloride 102 96 - 112 mEq/L   CO2 28 19 - 32 mEq/L   Glucose, Bld 142 (H) 70 - 99 mg/dL   BUN 16 6 - 23 mg/dL   Creatinine, Ser 1.06 0.40 - 1.20 mg/dL   Total Bilirubin 0.6 0.2 - 1.2 mg/dL   Alkaline Phosphatase 71 39 - 117 U/L   AST 17 0 - 37 U/L   ALT 14 0 - 35 U/L   Total Protein 7.9 6.0 - 8.3 g/dL   Albumin 4.6 3.5 - 5.2 g/dL  GFR 54.76 (L) >60.00 mL/min   Calcium 9.8 8.4 - 10.5 mg/dL  Lipid panel  Result Value Ref Range   Cholesterol 148 0 - 200 mg/dL   Triglycerides 101.0 0.0 - 149.0 mg/dL   HDL 43.00 >39.00 mg/dL   VLDL 20.2 0.0 - 40.0 mg/dL   LDL Cholesterol 85 0 - 99 mg/dL   Total CHOL/HDL Ratio 3    NonHDL 105.07   Hemoglobin A1c  Result Value Ref Range   Hgb A1c MFr Bld 7.3 (H) 4.6 - 6.5 %    This visit occurred during the SARS-CoV-2 public health emergency.  Safety protocols were in place, including screening questions prior to the visit, additional usage of staff PPE, and extensive cleaning of exam room while observing appropriate contact time as indicated for disinfecting solutions.   COVID 19 screen:  No recent travel or known exposure to COVID19 The patient denies respiratory symptoms of COVID 19 at this time. The importance of social distancing was discussed today.   Assessment and Plan   The patient's preventative maintenance and recommended screening tests for an annual wellness exam were reviewed in full today. Brought up to date unless services declined.  Counselled on the importance of diet, exercise, and its role in overall health and mortality. The patient's FH and SH was reviewed, including their home life, tobacco status, and drug and alcohol status.   Last PAP nml 2010, 2012..high risk HPV 05/2012, high risk HPV 08/2013,  And again in 2016 .Marland Kitchen Referred to Dr. Nori Riis GYN, neg colposcopy..  2018 neg pap, neg HPV.... no further indicated given age. No family history of ovarian or uterine cancer. Mammo 2020.. repeat due 03/2021 Colon cancer  screening: Could not tolerate golytely in past, unable to do colonoscopy 2010.  Plan  Cologuard once has Medicare in Dec. Vaccines up to date with Tdap, PNA given  HD flu vaccine.   Due for shingles.  DEXA: no early osteo in family.. Plan start 64.   DUE Hep C screening done  HIV: done  Problem List Items Addressed This Visit     Diabetes mellitus with circulatory complication, Hypertension ([place])    Stable, chronic.  Continue current medication.    farxiga 10 mg... not really taking metfomrin except rarely given nausea.      Hyperlipidemia associated with type 2 diabetes mellitus (HCC)    Stable, chronic.  Continue current medication.    atorvastatin 20 mg daily      Hypertension associated with diabetes (HCC)    Stable, chronic.  Continue current medication.   Good control on amlodipine , HCTZ and losartan.      Other Visit Diagnoses     Welcome to Medicare preventive visit    -  Primary   Colon cancer screening       Relevant Orders   Cologuard   Estrogen deficiency       Relevant Orders   DG Bone Density       Eliezer Lofts, MD

## 2021-03-27 NOTE — Assessment & Plan Note (Signed)
Stable, chronic.  Continue current medication.   Good control on amlodipine , HCTZ and losartan.

## 2021-04-07 ENCOUNTER — Other Ambulatory Visit: Payer: Self-pay | Admitting: Family Medicine

## 2021-04-09 IMAGING — MG DIGITAL SCREENING BILAT W/ CAD
4 series · 4 of 4 positions shown · non-contrast
Comparison: Previous exam(s).

CLINICAL DATA: Screening.

EXAM:
DIGITAL SCREENING BILATERAL MAMMOGRAM WITH CAD

[R CC]
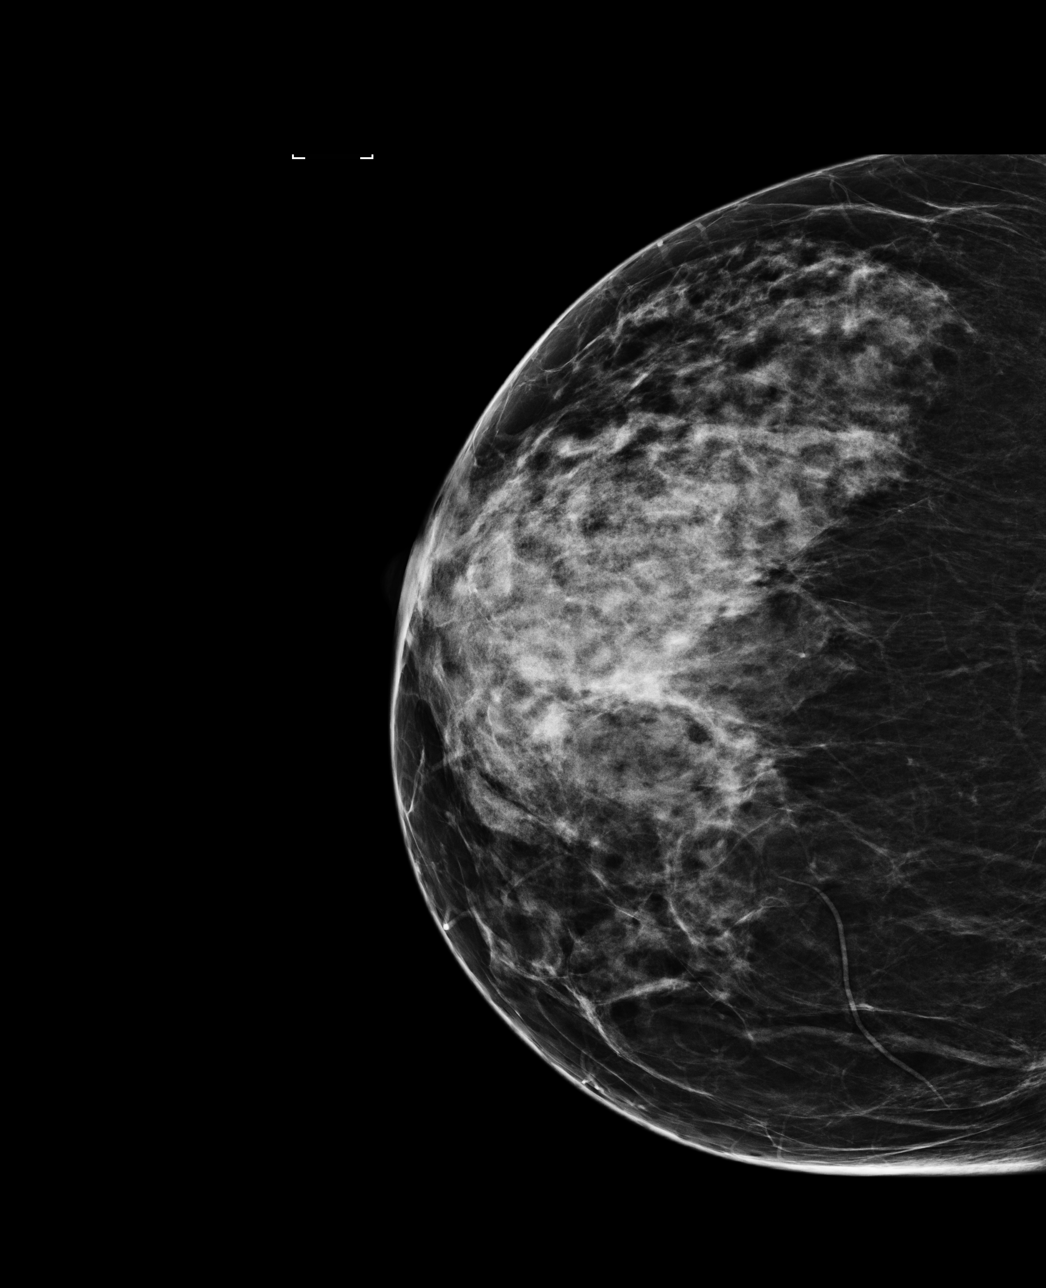

[L MLO]
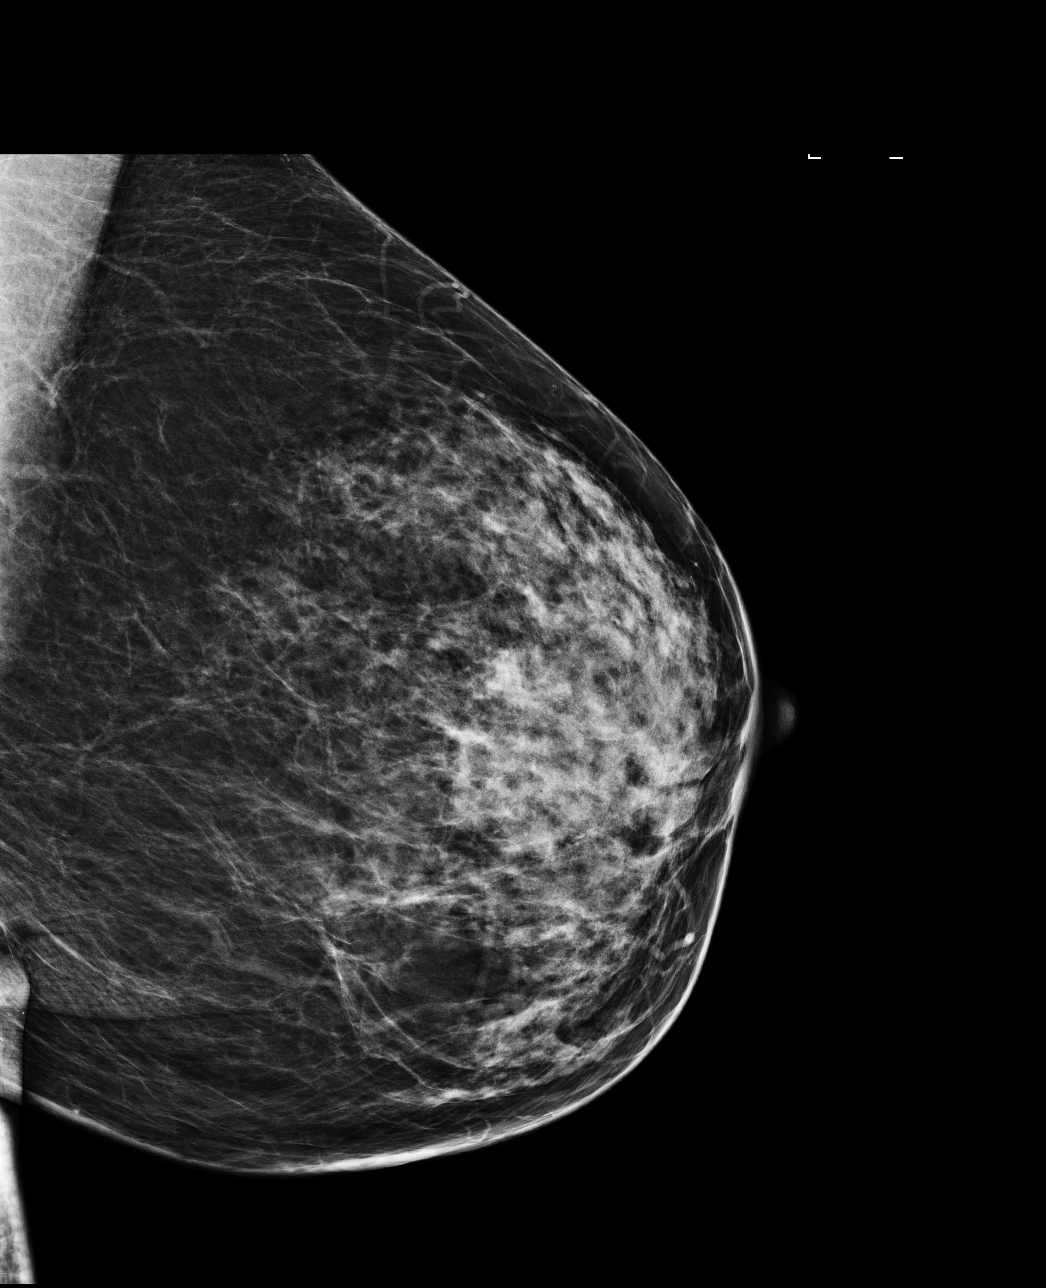

[R MLO]
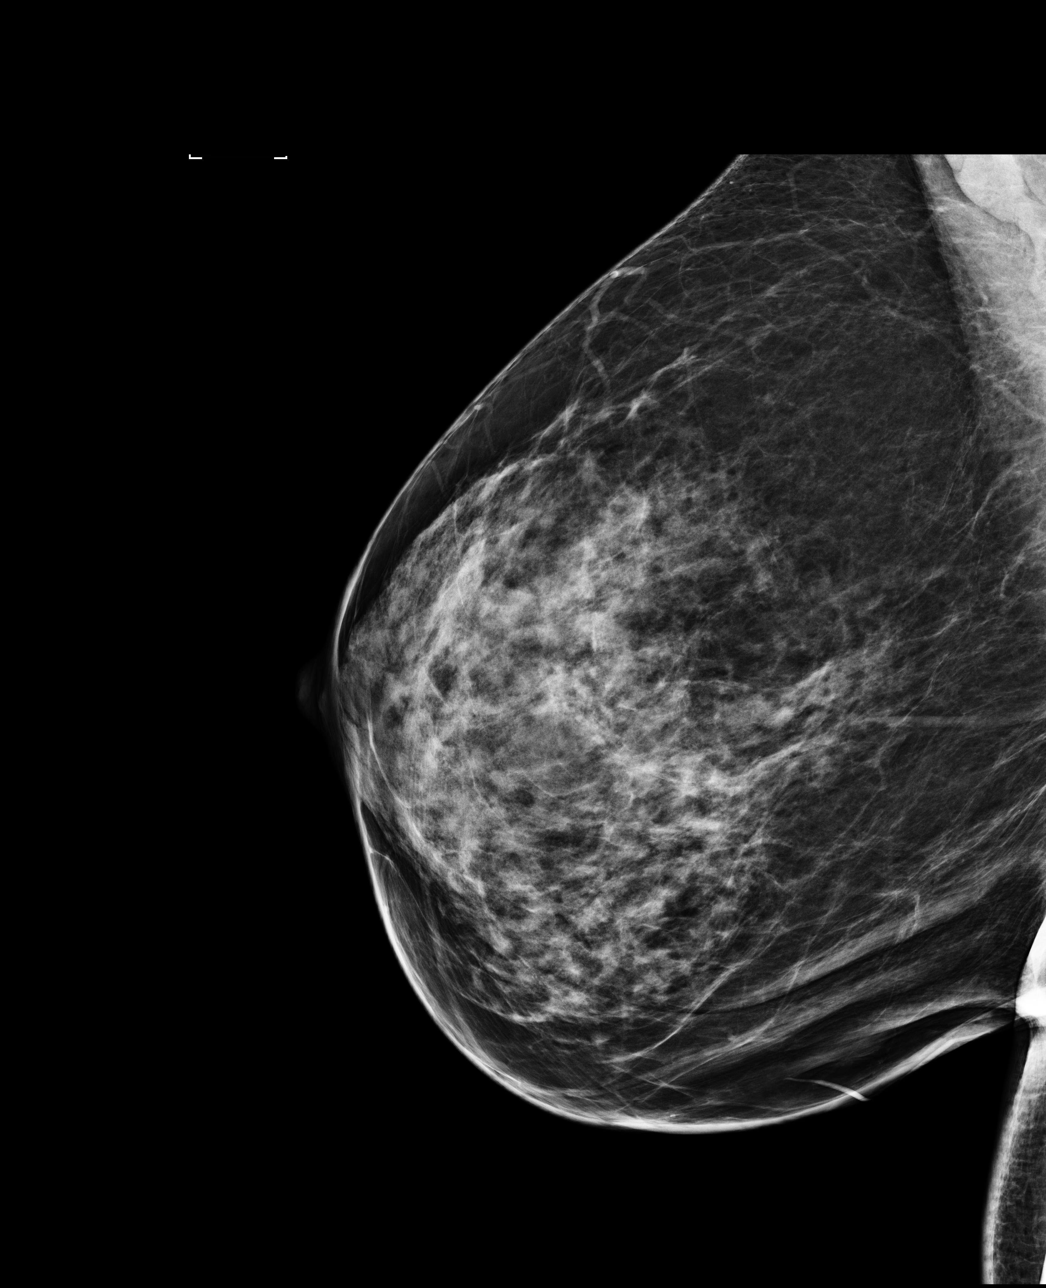

[L CC]
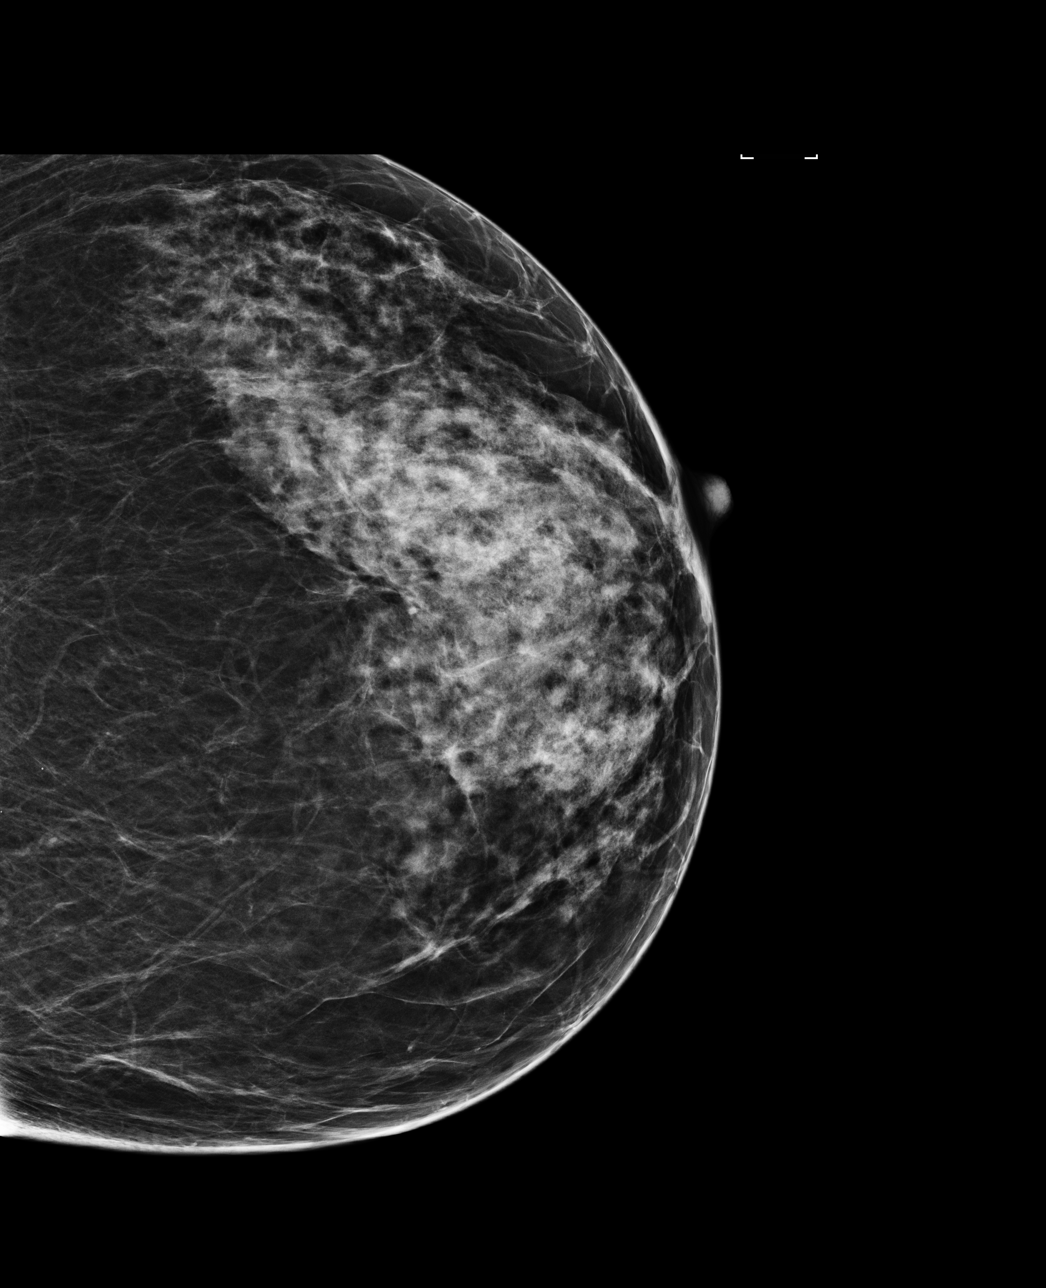

[4 of 4 positions shown; findings below may reference images not displayed]

ACR Breast Density Category c: The breast tissue is heterogeneously
dense, which may obscure small masses.
FINDINGS: There are no findings suspicious for malignancy. Images were
processed with CAD.
IMPRESSION: No mammographic evidence of malignancy. A result letter of this
screening mammogram will be mailed directly to the patient.

RECOMMENDATION:
Screening mammogram in one year. (Code:[id number])

BI-RADS CATEGORY  1: Negative.

## 2021-05-06 ENCOUNTER — Telehealth: Payer: Self-pay | Admitting: Family Medicine

## 2021-05-06 MED ORDER — ATORVASTATIN CALCIUM 20 MG PO TABS: 20.0000 mg | ORAL_TABLET | Freq: Every day | ORAL | 3 refills | Status: DC

## 2021-05-06 MED ORDER — METFORMIN HCL ER 500 MG PO TB24: 1000.0000 mg | ORAL_TABLET | Freq: Every day | ORAL | 3 refills | Status: DC

## 2021-05-06 MED ORDER — LOSARTAN POTASSIUM 100 MG PO TABS: 100.0000 mg | ORAL_TABLET | Freq: Every day | ORAL | 3 refills | Status: DC

## 2021-05-06 MED ORDER — HYDROCHLOROTHIAZIDE 25 MG PO TABS: 25.0000 mg | ORAL_TABLET | Freq: Every day | ORAL | 3 refills | Status: DC

## 2021-05-06 NOTE — Addendum Note (Signed)
Addended by: Damita Lack on: 05/06/2021 04:05 PM   Modules accepted: Orders

## 2021-05-06 NOTE — Telephone Encounter (Signed)
°  Encourage patient to contact the pharmacy for refills or they can request refills through [facility]  LAST APPOINTMENT DATE:  Please schedule appointment if longer than 1 year  NEXT APPOINTMENT DATE: 07/03/21  MEDICATION: all meds  Is the patient out of medication?  yes  [facility] [place]  Let patient know to contact pharmacy at the end of the day to make sure medication is ready.  Please notify patient to allow 48-72 hours to process  CLINICAL FILLS OUT ALL BELOW:   LAST REFILL:  QTY:  REFILL DATE:    OTHER COMMENTS:    Okay for refill?  Please advise

## 2021-05-06 NOTE — Telephone Encounter (Signed)
[facility] [place]. 

## 2021-05-13 LAB — COLOGUARD: COLOGUARD: NEGATIVE

## 2021-05-19 ENCOUNTER — Encounter: Payer: Self-pay | Admitting: Family Medicine

## 2021-05-19 LAB — HM DIABETES EYE EXAM

## 2021-07-03 ENCOUNTER — Ambulatory Visit: Payer: 59 | Admitting: Family Medicine

## 2021-07-10 ENCOUNTER — Encounter: Payer: Self-pay | Admitting: Family Medicine

## 2021-07-10 ENCOUNTER — Other Ambulatory Visit: Payer: Self-pay

## 2021-07-10 ENCOUNTER — Ambulatory Visit (INDEPENDENT_AMBULATORY_CARE_PROVIDER_SITE_OTHER): Payer: 59 | Admitting: Family Medicine

## 2021-07-10 VITALS — BP 134/70 | HR 52 | Ht 65.5 in | Wt 160.0 lb

## 2021-07-10 DIAGNOSIS — E1159 Type 2 diabetes mellitus with other circulatory complications: Secondary | ICD-10-CM | POA: Diagnosis not present

## 2021-07-10 DIAGNOSIS — I152 Hypertension secondary to endocrine disorders: Secondary | ICD-10-CM

## 2021-07-10 LAB — POCT GLYCOSYLATED HEMOGLOBIN (HGB A1C): Hemoglobin A1C: 7 % — AB (ref 4.0–5.6)

## 2021-07-10 NOTE — Patient Instructions (Signed)
Keep working on healthy eating and regular exercise! 

## 2021-07-10 NOTE — Assessment & Plan Note (Signed)
Stable, chronic.  Continue current medication. ? ? ? Amlodipine 10 mg daily ? losartan 100 mg daily ? HCTZ 25 mg daily ? ? ?

## 2021-07-10 NOTE — Assessment & Plan Note (Signed)
.   Chronic, well controlled now ? ?Tolerating metformin 500 mg XL at ONCE daily, farxiga 10 mg daily ? ?Encouraged exercise, weight loss, healthy eating habits. ? ?

## 2021-07-10 NOTE — Progress Notes (Signed)
? ? Patient ID: ERIELLE GAWRONSKI, female    DOB: 10-01-1955, 66 y.o.   MRN: [id number] ? ?This visit was conducted in person. ? ?BP 134/70   Pulse (!) 52   Ht 5' 5.5" (1.664 m)   Wt 160 lb (72.6 kg)   SpO2 98%   BMI 26.22 kg/m?   ? ?CC:  ?Chief Complaint  ?Patient presents with  ? Follow-up  ?  3 follow up for diabetes   ? ? ?Subjective:  ? ?HPI: ?ADLEY CASTELLO is a 66 y.o. female presenting on 07/10/2021 for Follow-up (3 follow up for diabetes ) ? ?Diabetes:  Improving control ? A1C 7 ? On farxiga 10 mg daily, metformin XR  500 mg daily.Marland Kitchen less nausea on lower dose ?Using medications without difficulties: ?Hypoglycemic episodes: none ?Hyperglycemic episodes: none ?Feet problems: no ulcer ?Blood Sugars averaging: FBS 125 ?eye exam within last year: yes ? ?Wt Readings from Last 3 Encounters:  ?07/10/21 160 lb (72.6 kg)  ?03/27/21 160 lb 2 oz (72.6 kg)  ?12/11/20 162 lb 8 oz (73.7 kg)  ? ? ? ?  Hypertension:   Good control on amlodipine , HCTZ and losartan  ?BP Readings from Last 3 Encounters:  ?07/10/21 134/70  ?03/27/21 130/78  ?12/11/20 130/80  ?Using medication without problems or lightheadedness:  none ?Chest pain with exertion: none ?Edema: none ?Short of breath: none ?Average home BPs: 130/70 ?Other issues: ? ? ?Relevant past medical, surgical, family and social history reviewed and updated as indicated. Interim medical history since our last visit reviewed. ?Allergies and medications reviewed and updated. ?Outpatient Medications Prior to Visit  ?Medication Sig Dispense Refill  ? amLODipine (NORVASC) 10 MG tablet TAKE 1 TABLET BY MOUTH EVERY DAY 90 tablet 3  ? atorvastatin (LIPITOR) 20 MG tablet Take 1 tablet (20 mg total) by mouth daily. 90 tablet 3  ? Blood Glucose Monitoring Suppl (ONE TOUCH ULTRA 2) w/Device KIT Check blood sugar twice a day and as directed. Dx E11.65 1 kit 0  ? cyclobenzaprine (FLEXERIL) 10 MG tablet Take 1 tablet (10 mg total) by mouth at bedtime as needed for muscle spasms. 15 tablet  0  ? FARXIGA 10 MG TABS tablet TAKE 1 TABLET BY MOUTH DAILY BEFORE BREAKFAST. 90 tablet 3  ? glucose blood (ONE TOUCH ULTRA TEST) test strip Check blood sugar twice a day and as directed.Dx E11.65 100 each 3  ? hydrochlorothiazide (HYDRODIURIL) 25 MG tablet Take 1 tablet (25 mg total) by mouth daily. 90 tablet 3  ? Lancets (ONETOUCH ULTRASOFT) lancets Check blood sugar twice a day and as directed. Dx E11.65 100 each 3  ? losartan (COZAAR) 100 MG tablet Take 1 tablet (100 mg total) by mouth daily. 90 tablet 3  ? metFORMIN (GLUCOPHAGE-XR) 500 MG 24 hr tablet Take 2 tablets (1,000 mg total) by mouth daily with breakfast. 180 tablet 3  ? ?No facility-administered medications prior to visit.  ?  ? ?Per HPI unless specifically indicated in ROS section below ?Review of Systems  ?Constitutional:  Negative for fatigue and fever.  ?HENT:  Negative for congestion.   ?Eyes:  Negative for pain.  ?Respiratory:  Negative for cough and shortness of breath.   ?Cardiovascular:  Negative for chest pain, palpitations and leg swelling.  ?Gastrointestinal:  Negative for abdominal pain.  ?Genitourinary:  Negative for dysuria and vaginal bleeding.  ?Musculoskeletal:  Negative for back pain.  ?Neurological:  Negative for syncope, light-headedness and headaches.  ?Psychiatric/Behavioral:  Negative for dysphoric mood.   ?  Objective:  ?BP 134/70   Pulse (!) 52   Ht 5' 5.5" (1.664 m)   Wt 160 lb (72.6 kg)   SpO2 98%   BMI 26.22 kg/m?   ?Wt Readings from Last 3 Encounters:  ?07/10/21 160 lb (72.6 kg)  ?03/27/21 160 lb 2 oz (72.6 kg)  ?12/11/20 162 lb 8 oz (73.7 kg)  ?  ?  ?Physical Exam ?Constitutional:   ?   General: She is not in acute distress. ?   Appearance: Normal appearance. She is well-developed. She is not ill-appearing or toxic-appearing.  ?HENT:  ?   Head: Normocephalic.  ?   Right Ear: Hearing, tympanic membrane, ear canal and external ear normal. Tympanic membrane is not erythematous, retracted or bulging.  ?   Left Ear:  Hearing, tympanic membrane, ear canal and external ear normal. Tympanic membrane is not erythematous, retracted or bulging.  ?   Nose: No mucosal edema or rhinorrhea.  ?   Right Sinus: No maxillary sinus tenderness or frontal sinus tenderness.  ?   Left Sinus: No maxillary sinus tenderness or frontal sinus tenderness.  ?   Mouth/Throat:  ?   Pharynx: Uvula midline.  ?Eyes:  ?   General: Lids are normal. Lids are everted, no foreign bodies appreciated.  ?   Conjunctiva/sclera: Conjunctivae normal.  ?   Pupils: Pupils are equal, round, and reactive to light.  ?Neck:  ?   Thyroid: No thyroid mass or thyromegaly.  ?   Vascular: No carotid bruit.  ?   Trachea: Trachea normal.  ?Cardiovascular:  ?   Rate and Rhythm: Normal rate and regular rhythm.  ?   Pulses: Normal pulses.  ?   Heart sounds: Normal heart sounds, S1 normal and S2 normal. No murmur heard. ?  No friction rub. No gallop.  ?Pulmonary:  ?   Effort: Pulmonary effort is normal. No tachypnea or respiratory distress.  ?   Breath sounds: Normal breath sounds. No decreased breath sounds, wheezing, rhonchi or rales.  ?Abdominal:  ?   General: Bowel sounds are normal.  ?   Palpations: Abdomen is soft.  ?   Tenderness: There is no abdominal tenderness.  ?Musculoskeletal:  ?   Cervical back: Normal range of motion and neck supple.  ?Skin: ?   General: Skin is warm and dry.  ?   Findings: No rash.  ?Neurological:  ?   Mental Status: She is alert.  ?Psychiatric:     ?   Mood and Affect: Mood is not anxious or depressed.     ?   Speech: Speech normal.     ?   Behavior: Behavior normal. Behavior is cooperative.     ?   Thought Content: Thought content normal.     ?   Judgment: Judgment normal.  ? ?   ?Results for orders placed or performed in visit on 05/19/21  ?HM DIABETES EYE EXAM  ?Result Value Ref Range  ? HM Diabetic Eye Exam No Retinopathy No Retinopathy  ? ? ?This visit occurred during the SARS-CoV-2 public health emergency.  Safety protocols were in place,  including screening questions prior to the visit, additional usage of staff PPE, and extensive cleaning of exam room while observing appropriate contact time as indicated for disinfecting solutions.  ? ?COVID 19 screen:  No recent travel or known exposure to [place] ?The patient denies respiratory symptoms of COVID 19 at this time. ?The importance of social distancing was discussed today.  ? ?Assessment and Plan ?  Problem List Items Addressed This Visit   ? ? Diabetes mellitus with circulatory complication, Hypertension ([place]) - Primary  ?  Marland Kitchen Chronic, well controlled now ? ?Tolerating metformin 500 mg XL at ONCE daily, farxiga 10 mg daily ? ?Encouraged exercise, weight loss, healthy eating habits. ? ?  ?  ? Relevant Medications  ? metFORMIN (GLUCOPHAGE-XR) 500 MG 24 hr tablet  ? Other Relevant Orders  ? POCT glycosylated hemoglobin (Hb A1C) (Completed)  ? Hypertension associated with diabetes ([place])  ?  Stable, chronic.  Continue current medication. ? ? ? Amlodipine 10 mg daily ? losartan 100 mg daily ? HCTZ 25 mg daily ? ? ?  ?  ? Relevant Medications  ? metFORMIN (GLUCOPHAGE-XR) 500 MG 24 hr tablet  ? ? ?  ? ?Eliezer Lofts, MD  ? ?

## 2021-10-02 ENCOUNTER — Encounter: Payer: Self-pay | Admitting: Family Medicine

## 2021-10-02 ENCOUNTER — Ambulatory Visit (INDEPENDENT_AMBULATORY_CARE_PROVIDER_SITE_OTHER): Payer: 59 | Admitting: Family Medicine

## 2021-10-02 VITALS — BP 143/73 | HR 67 | Temp 97.4°F | Ht 65.5 in | Wt 161.6 lb

## 2021-10-02 DIAGNOSIS — L0291 Cutaneous abscess, unspecified: Secondary | ICD-10-CM

## 2021-10-02 MED ORDER — DOXYCYCLINE HYCLATE 100 MG PO TABS: 100.0000 mg | ORAL_TABLET | Freq: Two times a day (BID) | ORAL | 0 refills | Status: AC

## 2021-10-02 MED ORDER — IBUPROFEN 600 MG PO TABS: 600.0000 mg | ORAL_TABLET | Freq: Three times a day (TID) | ORAL | 0 refills | Status: DC | PRN

## 2021-10-02 NOTE — Progress Notes (Addendum)
Incision and Drainage Procedure Note  Pre-operative Diagnosis: Right axillary abscess  Post-operative Diagnosis: normal  Indications: Abscess   Anesthesia: 1% lidocaine with epinephrine  Procedure Details  The procedure, risks and complications have been discussed in detail (including, but not limited to airway compromise, infection, bleeding) with the patient, and the patient has signed consent to the procedure.  The skin was sterilely prepped and draped over the affected area in the usual fashion. After adequate local anesthesia, I&D with a #11 blade was performed on the right axilla. Purulent drainage: present The patient was observed until stable.  Findings: Abscess  EBL: minimal cc's  Drains: purulent  Condition: Tolerated procedure well  Will also rx doxycycline due to h/o diabetes and advanged age  Complications: none.

## 2021-11-17 ENCOUNTER — Telehealth: Payer: Self-pay | Admitting: Family Medicine

## 2021-11-17 NOTE — Telephone Encounter (Signed)
I don't see where patient has been seen for back pain.  Please call and schedule office visit.

## 2021-11-17 NOTE — Telephone Encounter (Signed)
Patient called and stated can she be prescribed diclofenac for her pain in her back. Call back [phone].

## 2021-11-19 MED ORDER — DICLOFENAC SODIUM 75 MG PO TBEC: 75.0000 mg | DELAYED_RELEASE_TABLET | Freq: Two times a day (BID) | ORAL | 0 refills | Status: DC

## 2021-11-19 NOTE — Telephone Encounter (Signed)
I will send in a short course of diclofenac for her to use until she has seen at August 11 office visit.

## 2021-11-19 NOTE — Telephone Encounter (Signed)
Patient scheduled for the back pain appointment Fri 12/04/2021. She also stated that she had the medication prescribed for her back in 2017.

## 2021-11-19 NOTE — Telephone Encounter (Signed)
Left message for Dana Rogers that Dr. Ermalene Searing sent her [facility] [place] [facility] her to use until we see her on 12/04/21.  I ask that she call me back if she has any questions.

## 2021-12-04 ENCOUNTER — Encounter: Payer: Self-pay | Admitting: Family Medicine

## 2021-12-04 ENCOUNTER — Ambulatory Visit (INDEPENDENT_AMBULATORY_CARE_PROVIDER_SITE_OTHER): Payer: 59 | Admitting: Family Medicine

## 2021-12-04 VITALS — BP 124/70 | HR 58 | Temp 98.0°F | Ht 65.5 in | Wt 162.0 lb

## 2021-12-04 DIAGNOSIS — M545 Low back pain, unspecified: Secondary | ICD-10-CM

## 2021-12-04 DIAGNOSIS — G8929 Other chronic pain: Secondary | ICD-10-CM | POA: Diagnosis not present

## 2021-12-04 MED ORDER — CYCLOBENZAPRINE HCL 10 MG PO TABS: 10.0000 mg | ORAL_TABLET | Freq: Every evening | ORAL | 1 refills | Status: AC | PRN

## 2021-12-04 NOTE — Progress Notes (Signed)
Patient ID: Dana Rogers, female    DOB: 1955/11/16, 66 y.o.   MRN: [id number]  This visit was conducted in person.  BP 124/70 (BP Location: Left Arm, Patient Position: Sitting, Cuff Size: Normal)   Pulse (!) 58   Temp 98 F (36.7 C) (Temporal)   Ht 5' 5.5" (1.664 m)   Wt 162 lb (73.5 kg)   SpO2 97%   BMI 26.55 kg/m    CC:  Chief Complaint  Patient presents with   Back Pain    X 1 month. Patient states she was given diclofenac to take and it helps some but flexeril works better. Would like to get prescribed the flexeril again if possible.     Subjective:   HPI: Dana Rogers is a 66 y.o. female presenting on 12/04/2021 for Back Pain (X 1 month. Patient states she was given diclofenac to take and it helps some but flexeril works better. Would like to get prescribed the flexeril again if possible. )   Chronic intermittent lower left sided back pain.. worse in last few months. No change in activity, no fall.  No radiation of pain.  Pain is worse when going sitting to standing. No numbness, no weakness.  Pain in back wakes her up at night.  She has noted a burning patch on left medical ankle.. there is now a raised cyst near the area.   Diclofenac 75 mg BID  sent in 2 weeks ago not as helpful as flexeril was in 2017.  Used intermittently over the years.    No past X-rsay  No past back surgery.     Relevant past medical, surgical, family and social history reviewed and updated as indicated. Interim medical history since our last visit reviewed. Allergies and medications reviewed and updated. Outpatient Medications Prior to Visit  Medication Sig Dispense Refill   amLODipine (NORVASC) 10 MG tablet TAKE 1 TABLET BY MOUTH EVERY DAY 90 tablet 3   atorvastatin (LIPITOR) 20 MG tablet Take 1 tablet (20 mg total) by mouth daily. 90 tablet 3   Blood Glucose Monitoring Suppl (ONE TOUCH ULTRA 2) w/Device KIT Check blood sugar twice a day and as directed. Dx E11.65 1 kit 0    diclofenac (VOLTAREN) 75 MG EC tablet Take 1 tablet (75 mg total) by mouth 2 (two) times daily. 30 tablet 0   FARXIGA 10 MG TABS tablet TAKE 1 TABLET BY MOUTH DAILY BEFORE BREAKFAST. 90 tablet 3   glucose blood (ONE TOUCH ULTRA TEST) test strip Check blood sugar twice a day and as directed.Dx E11.65 100 each 3   hydrochlorothiazide (HYDRODIURIL) 25 MG tablet Take 1 tablet (25 mg total) by mouth daily. 90 tablet 3   ibuprofen (ADVIL) 600 MG tablet Take 1 tablet (600 mg total) by mouth every 8 (eight) hours as needed. 30 tablet 0   Lancets (ONETOUCH ULTRASOFT) lancets Check blood sugar twice a day and as directed. Dx E11.65 100 each 3   losartan (COZAAR) 100 MG tablet Take 1 tablet (100 mg total) by mouth daily. 90 tablet 3   metFORMIN (GLUCOPHAGE-XR) 500 MG 24 hr tablet Take 500 mg by mouth daily with breakfast.     cyclobenzaprine (FLEXERIL) 10 MG tablet Take 1 tablet (10 mg total) by mouth at bedtime as needed for muscle spasms. (Patient not taking: Reported on 12/04/2021) 15 tablet 0   No facility-administered medications prior to visit.     Per HPI unless specifically indicated in ROS section below Review  of Systems  Constitutional:  Negative for fatigue and fever.  HENT:  Negative for congestion.   Eyes:  Negative for pain.  Respiratory:  Negative for cough and shortness of breath.   Cardiovascular:  Negative for chest pain, palpitations and leg swelling.  Gastrointestinal:  Negative for abdominal pain.  Genitourinary:  Negative for dysuria and vaginal bleeding.  Musculoskeletal:  Negative for back pain.  Neurological:  Negative for syncope, light-headedness and headaches.  Psychiatric/Behavioral:  Negative for dysphoric mood.    Objective:  BP 124/70 (BP Location: Left Arm, Patient Position: Sitting, Cuff Size: Normal)   Pulse (!) 58   Temp 98 F (36.7 C) (Temporal)   Ht 5' 5.5" (1.664 m)   Wt 162 lb (73.5 kg)   SpO2 97%   BMI 26.55 kg/m   Wt Readings from Last 3 Encounters:   12/04/21 162 lb (73.5 kg)  10/02/21 161 lb 9.6 oz (73.3 kg)  07/10/21 160 lb (72.6 kg)      Physical Exam Constitutional:      General: She is not in acute distress.    Appearance: Normal appearance. She is well-developed. She is not ill-appearing or toxic-appearing.  HENT:     Head: Normocephalic.     Right Ear: Hearing, tympanic membrane, ear canal and external ear normal. Tympanic membrane is not erythematous, retracted or bulging.     Left Ear: Hearing, tympanic membrane, ear canal and external ear normal. Tympanic membrane is not erythematous, retracted or bulging.     Nose: No mucosal edema or rhinorrhea.     Right Sinus: No maxillary sinus tenderness or frontal sinus tenderness.     Left Sinus: No maxillary sinus tenderness or frontal sinus tenderness.     Mouth/Throat:     Pharynx: Uvula midline.  Eyes:     General: Lids are normal. Lids are everted, no foreign bodies appreciated.     Conjunctiva/sclera: Conjunctivae normal.     Pupils: Pupils are equal, round, and reactive to light.  Neck:     Thyroid: No thyroid mass or thyromegaly.     Vascular: No carotid bruit.     Trachea: Trachea normal.  Cardiovascular:     Rate and Rhythm: Normal rate and regular rhythm.     Pulses: Normal pulses.     Heart sounds: Normal heart sounds, S1 normal and S2 normal. No murmur heard.    No friction rub. No gallop.  Pulmonary:     Effort: Pulmonary effort is normal. No tachypnea or respiratory distress.     Breath sounds: Normal breath sounds. No decreased breath sounds, wheezing, rhonchi or rales.  Abdominal:     General: Bowel sounds are normal.     Palpations: Abdomen is soft.     Tenderness: There is no abdominal tenderness.  Musculoskeletal:     Cervical back: Normal, normal range of motion and neck supple.     Thoracic back: Normal.     Lumbar back: Normal. No swelling, spasms or tenderness. Normal range of motion. Negative right straight leg raise test and negative left  straight leg raise test.  Skin:    General: Skin is warm and dry.     Findings: No rash.  Neurological:     Mental Status: She is alert.  Psychiatric:        Mood and Affect: Mood is not anxious or depressed.        Speech: Speech normal.        Behavior: Behavior normal. Behavior is cooperative.  Thought Content: Thought content normal.        Judgment: Judgment normal.       Results for orders placed or performed in visit on 07/10/21  POCT glycosylated hemoglobin (Hb A1C)  Result Value Ref Range   Hemoglobin A1C 7.0 (A) 4.0 - 5.6 %   HbA1c POC (<> result, manual entry)     HbA1c, POC (prediabetic range)     HbA1c, POC (controlled diabetic range)       COVID 19 screen:  No recent travel or known exposure to COVID19 The patient denies respiratory symptoms of COVID 19 at this time. The importance of social distancing was discussed today.   Assessment and Plan    Problem List Items Addressed This Visit     Chronic low back pain - Primary    Acute flare of chronic issue No focal vertebral tenderness, no clear indication for x-ray except for persistence of pain.  She does not want if move forward with further evaluation at this time, given pain has improved some. Treat with home physical therapy, heat and Flexeril as needed for pain/muscle spasm.       Relevant Medications   cyclobenzaprine (FLEXERIL) 10 MG tablet     Eliezer Lofts, MD

## 2021-12-04 NOTE — Patient Instructions (Signed)
Start home physical therapy. Apply heat. Can use Flexeril as needed. If not improving as expected and or becoming persistent, please return for further evaluation including x-ray.

## 2021-12-04 NOTE — Assessment & Plan Note (Signed)
Acute flare of chronic issue No focal vertebral tenderness, no clear indication for x-ray except for persistence of pain.  She does not want if move forward with further evaluation at this time, given pain has improved some. Treat with home physical therapy, heat and Flexeril as needed for pain/muscle spasm.

## 2021-12-28 ENCOUNTER — Telehealth: Payer: Self-pay | Admitting: Family Medicine

## 2021-12-28 DIAGNOSIS — E1159 Type 2 diabetes mellitus with other circulatory complications: Secondary | ICD-10-CM

## 2021-12-28 NOTE — Telephone Encounter (Signed)
-----   Message from Alvina Chou sent at 12/22/2021  3:21 PM EDT ----- Regarding: Lab orders for Tuesday, 9.12.23 Lab orders for a 6 month follow up appt

## 2022-01-05 ENCOUNTER — Other Ambulatory Visit (INDEPENDENT_AMBULATORY_CARE_PROVIDER_SITE_OTHER): Payer: 59

## 2022-01-05 DIAGNOSIS — E1159 Type 2 diabetes mellitus with other circulatory complications: Secondary | ICD-10-CM | POA: Diagnosis not present

## 2022-01-05 LAB — COMPREHENSIVE METABOLIC PANEL
ALT: 12 U/L (ref 0–35)
AST: 15 U/L (ref 0–37)
Albumin: 4.2 g/dL (ref 3.5–5.2)
Alkaline Phosphatase: 60 U/L (ref 39–117)
BUN: 18 mg/dL (ref 6–23)
CO2: 30 mEq/L (ref 19–32)
Calcium: 9.5 mg/dL (ref 8.4–10.5)
Chloride: 103 mEq/L (ref 96–112)
Creatinine, Ser: 1.03 mg/dL (ref 0.40–1.20)
GFR: 56.36 mL/min — ABNORMAL LOW (ref >60.00)
Glucose, Bld: 134 mg/dL — ABNORMAL HIGH (ref 70–99)
Potassium: 3.6 mEq/L (ref 3.5–5.1)
Sodium: 140 mEq/L (ref 135–145)
Total Bilirubin: 0.9 mg/dL (ref 0.2–1.2)
Total Protein: 7.3 g/dL (ref 6.0–8.3)

## 2022-01-05 LAB — LIPID PANEL
Cholesterol: 153 mg/dL (ref 0–200)
HDL: 46.1 mg/dL (ref >39.00)
LDL Cholesterol: 91 mg/dL (ref 0–99)
NonHDL: 106.82
Total CHOL/HDL Ratio: 3
Triglycerides: 77 mg/dL (ref 0.0–149.0)
VLDL: 15.4 mg/dL (ref 0.0–40.0)

## 2022-01-05 LAB — HEMOGLOBIN A1C: Hgb A1c MFr Bld: 7.8 % — ABNORMAL HIGH (ref 4.6–6.5)

## 2022-01-05 NOTE — Progress Notes (Signed)
No critical labs need to be addressed urgently. We will discuss labs in detail at upcoming office visit.   

## 2022-01-12 ENCOUNTER — Ambulatory Visit: Payer: 59 | Admitting: Family Medicine

## 2022-01-14 ENCOUNTER — Ambulatory Visit (INDEPENDENT_AMBULATORY_CARE_PROVIDER_SITE_OTHER): Payer: 59 | Admitting: Family Medicine

## 2022-01-14 VITALS — BP 136/72 | HR 82 | Temp 96.8°F | Resp 14 | Ht 65.5 in | Wt 167.2 lb

## 2022-01-14 DIAGNOSIS — E1169 Type 2 diabetes mellitus with other specified complication: Secondary | ICD-10-CM | POA: Diagnosis not present

## 2022-01-14 DIAGNOSIS — E785 Hyperlipidemia, unspecified: Secondary | ICD-10-CM | POA: Diagnosis not present

## 2022-01-14 DIAGNOSIS — Z23 Encounter for immunization: Secondary | ICD-10-CM

## 2022-01-14 DIAGNOSIS — E1159 Type 2 diabetes mellitus with other circulatory complications: Secondary | ICD-10-CM

## 2022-01-14 DIAGNOSIS — I152 Hypertension secondary to endocrine disorders: Secondary | ICD-10-CM | POA: Diagnosis not present

## 2022-01-14 NOTE — Assessment & Plan Note (Addendum)
CHRONIC Inadequate control despite metformin 500 mg XL once daily and Farxiga 10 mg daily    she has started eating out breakfast fast food and feels this is what is causing her increase in A1C. She will get back on track with healthy eating and we will re-eval in 6 months at her physical.

## 2022-01-14 NOTE — Assessment & Plan Note (Signed)
Well-controlled on amlodipine 10 mg daily, losartan 100 mg p.o. daily and hydrochlorothiazide 25 mg p.o. daily

## 2022-01-14 NOTE — Addendum Note (Signed)
Addended by: Kris Mouton on: 01/14/2022 12:13 PM   Modules accepted: Orders

## 2022-01-14 NOTE — Progress Notes (Signed)
Patient ID: Dana Rogers, female    DOB: [birth year], 66 y.o.   MRN: [id number]  This visit was conducted in person.  BP 136/72   Pulse 82   Temp (!) 96.8 F (36 C)   Resp 14   Ht 5' 5.5" (1.664 m)   Wt 167 lb 4 oz (75.9 kg)   SpO2 95%   BMI 27.41 kg/m    CC:  Chief Complaint  Patient presents with   Diabetes    Follow up    Subjective:   HPI: Dana Rogers is a 66 y.o. female presenting on 01/14/2022 for Diabetes (Follow up)   Diabetes: Inadequate, worsened control despite metformin 500 mg XL once daily and Farxiga 10 mg daily   She has been eating out every morning... [employer]. Lab Results  Component Value Date   HGBA1C 7.8 (H) 01/05/2022  using medications without difficulties: Hypoglycemic episodes: none Hyperglycemic episodes: none Feet problems: no ulcer Blood Sugars averaging:  not recently eye exam within last year: Wt Readings from Last 3 Encounters:  01/14/22 167 lb 4 oz (75.9 kg)  12/04/21 162 lb (73.5 kg)  10/02/21 161 lb 9.6 oz (73.3 kg)     Hypertension:  Well-controlled on amlodipine 10 mg daily, losartan 100 mg p.o. daily and hydrochlorothiazide 25 mg p.o. daily  BP Readings from Last 3 Encounters:  01/14/22 136/72  12/04/21 124/70  10/02/21 (!) 143/73  Using medication without problems or lightheadedness:  none Chest pain with exertion: none Edema:none Short of breath:none Average home BPs: Other issues:  Elevated Cholesterol: Well-controlled on atorvastatin 20 mg p.o. daily Lab Results  Component Value Date   CHOL 153 01/05/2022   HDL 46.10 01/05/2022   LDLCALC 91 01/05/2022   TRIG 77.0 01/05/2022   CHOLHDL 3 01/05/2022  Using medications without problems: none Muscle aches:  Diet compliance: moderate Exercise: working Other complaints:       Relevant past medical, surgical, family and social history reviewed and updated as indicated. Interim medical history since our last visit reviewed. Allergies and  medications reviewed and updated. Outpatient Medications Prior to Visit  Medication Sig Dispense Refill   amLODipine (NORVASC) 10 MG tablet TAKE 1 TABLET BY MOUTH EVERY DAY 90 tablet 3   atorvastatin (LIPITOR) 20 MG tablet Take 1 tablet (20 mg total) by mouth daily. 90 tablet 3   Blood Glucose Monitoring Suppl (ONE TOUCH ULTRA 2) w/Device KIT Check blood sugar twice a day and as directed. Dx E11.65 1 kit 0   cyclobenzaprine (FLEXERIL) 10 MG tablet Take 1 tablet (10 mg total) by mouth at bedtime as needed for muscle spasms. 30 tablet 1   diclofenac (VOLTAREN) 75 MG EC tablet Take 1 tablet (75 mg total) by mouth 2 (two) times daily. 30 tablet 0   FARXIGA 10 MG TABS tablet TAKE 1 TABLET BY MOUTH DAILY BEFORE BREAKFAST. 90 tablet 3   glucose blood (ONE TOUCH ULTRA TEST) test strip Check blood sugar twice a day and as directed.Dx E11.65 100 each 3   hydrochlorothiazide (HYDRODIURIL) 25 MG tablet Take 1 tablet (25 mg total) by mouth daily. 90 tablet 3   ibuprofen (ADVIL) 600 MG tablet Take 1 tablet (600 mg total) by mouth every 8 (eight) hours as needed. 30 tablet 0   Lancets (ONETOUCH ULTRASOFT) lancets Check blood sugar twice a day and as directed. Dx E11.65 100 each 3   losartan (COZAAR) 100 MG tablet Take 1 tablet (100 mg total) by mouth daily.  90 tablet 3   metFORMIN (GLUCOPHAGE-XR) 500 MG 24 hr tablet Take 500 mg by mouth daily with breakfast.     No facility-administered medications prior to visit.     Per HPI unless specifically indicated in ROS section below Review of Systems  Constitutional:  Negative for fatigue and fever.  HENT:  Negative for congestion.   Eyes:  Negative for pain.  Respiratory:  Negative for cough and shortness of breath.   Cardiovascular:  Negative for chest pain, palpitations and leg swelling.  Gastrointestinal:  Negative for abdominal pain.  Genitourinary:  Negative for dysuria and vaginal bleeding.  Musculoskeletal:  Negative for back pain.  Neurological:   Negative for syncope, light-headedness and headaches.  Psychiatric/Behavioral:  Negative for dysphoric mood.    Objective:  BP 136/72   Pulse 82   Temp (!) 96.8 F (36 C)   Resp 14   Ht 5' 5.5" (1.664 m)   Wt 167 lb 4 oz (75.9 kg)   SpO2 95%   BMI 27.41 kg/m   Wt Readings from Last 3 Encounters:  01/14/22 167 lb 4 oz (75.9 kg)  12/04/21 162 lb (73.5 kg)  10/02/21 161 lb 9.6 oz (73.3 kg)        Physical Exam Vitals and nursing note reviewed.  Constitutional:      General: She is not in acute distress.    Appearance: Normal appearance. She is well-developed. She is not ill-appearing or toxic-appearing.  HENT:     Head: Normocephalic.     Right Ear: Hearing, tympanic membrane, ear canal and external ear normal.     Left Ear: Hearing, tympanic membrane, ear canal and external ear normal.     Nose: Nose normal.  Eyes:     General: Lids are normal. Lids are everted, no foreign bodies appreciated.     Conjunctiva/sclera: Conjunctivae normal.     Pupils: Pupils are equal, round, and reactive to light.  Neck:     Thyroid: No thyroid mass or thyromegaly.     Vascular: No carotid bruit.     Trachea: Trachea normal.  Cardiovascular:     Rate and Rhythm: Normal rate and regular rhythm.     Heart sounds: Normal heart sounds, S1 normal and S2 normal. No murmur heard.    No gallop.  Pulmonary:     Effort: Pulmonary effort is normal. No respiratory distress.     Breath sounds: Normal breath sounds. No wheezing, rhonchi or rales.  Abdominal:     General: Bowel sounds are normal. There is no distension or abdominal bruit.     Palpations: Abdomen is soft. There is no fluid wave or mass.     Tenderness: There is no abdominal tenderness. There is no guarding or rebound.     Hernia: No hernia is present.  Musculoskeletal:     Cervical back: Normal range of motion and neck supple.  Lymphadenopathy:     Cervical: No cervical adenopathy.  Skin:    General: Skin is warm and dry.      Findings: No rash.  Neurological:     Mental Status: She is alert.     Cranial Nerves: No cranial nerve deficit.     Sensory: No sensory deficit.  Psychiatric:        Mood and Affect: Mood is not anxious or depressed.        Speech: Speech normal.        Behavior: Behavior normal. Behavior is cooperative.  Judgment: Judgment normal.       Results for orders placed or performed in visit on 01/05/22  Comprehensive metabolic panel  Result Value Ref Range   Sodium 140 135 - 145 mEq/L   Potassium 3.6 3.5 - 5.1 mEq/L   Chloride 103 96 - 112 mEq/L   CO2 30 19 - 32 mEq/L   Glucose, Bld 134 (H) 70 - 99 mg/dL   BUN 18 6 - 23 mg/dL   Creatinine, Ser 1.03 0.40 - 1.20 mg/dL   Total Bilirubin 0.9 0.2 - 1.2 mg/dL   Alkaline Phosphatase 60 39 - 117 U/L   AST 15 0 - 37 U/L   ALT 12 0 - 35 U/L   Total Protein 7.3 6.0 - 8.3 g/dL   Albumin 4.2 3.5 - 5.2 g/dL   GFR 56.36 (L) >60.00 mL/min   Calcium 9.5 8.4 - 10.5 mg/dL  Hemoglobin A1c  Result Value Ref Range   Hgb A1c MFr Bld 7.8 (H) 4.6 - 6.5 %  Lipid panel  Result Value Ref Range   Cholesterol 153 0 - 200 mg/dL   Triglycerides 77.0 0.0 - 149.0 mg/dL   HDL 46.10 >39.00 mg/dL   VLDL 15.4 0.0 - 40.0 mg/dL   LDL Cholesterol 91 0 - 99 mg/dL   Total CHOL/HDL Ratio 3    NonHDL 106.82      COVID 19 screen:  No recent travel or known exposure to COVID19 The patient denies respiratory symptoms of COVID 19 at this time. The importance of social distancing was discussed today.   Assessment and Plan    Problem List Items Addressed This Visit     Diabetes mellitus with circulatory complication, Hypertension ([place]) - Primary    CHRONIC Inadequate control despite metformin 500 mg XL once daily and Farxiga 10 mg daily    she has started eating out breakfast fast food and feels this is what is causing her increase in A1C. She will get back on track with healthy eating and we will re-eval in 6 months at her physical.      Hyperlipidemia  associated with type 2 diabetes mellitus ([place])    Well-controlled on atorvastatin 20 mg p.o. daily      Hypertension associated with diabetes ([place])    Well-controlled on amlodipine 10 mg daily, losartan 100 mg p.o. daily and hydrochlorothiazide 25 mg p.o. daily         Eliezer Lofts, MD

## 2022-01-14 NOTE — Assessment & Plan Note (Signed)
Well-controlled on atorvastatin 20 mg p.o. daily

## 2022-02-19 ENCOUNTER — Other Ambulatory Visit: Payer: Self-pay | Admitting: Family Medicine

## 2022-02-19 NOTE — Telephone Encounter (Signed)
Last office visit 01/14/22 for DM.  Last refilled 11/19/21 for #30 with no refills.  CPE scheduled for 07/15/2022.

## 2022-04-16 ENCOUNTER — Other Ambulatory Visit: Payer: Self-pay | Admitting: Family Medicine

## 2022-05-17 ENCOUNTER — Other Ambulatory Visit: Payer: Self-pay | Admitting: Family Medicine

## 2022-05-28 ENCOUNTER — Ambulatory Visit (INDEPENDENT_AMBULATORY_CARE_PROVIDER_SITE_OTHER): Payer: 59 | Admitting: Family Medicine

## 2022-05-28 ENCOUNTER — Encounter: Payer: Self-pay | Admitting: Family Medicine

## 2022-05-28 VITALS — BP 150/76 | HR 57 | Temp 97.5°F | Ht 65.5 in | Wt 162.0 lb

## 2022-05-28 DIAGNOSIS — E1159 Type 2 diabetes mellitus with other circulatory complications: Secondary | ICD-10-CM

## 2022-05-28 DIAGNOSIS — I152 Hypertension secondary to endocrine disorders: Secondary | ICD-10-CM | POA: Diagnosis not present

## 2022-05-28 DIAGNOSIS — M25551 Pain in right hip: Secondary | ICD-10-CM | POA: Diagnosis not present

## 2022-05-28 MED ORDER — ACCU-CHEK GUIDE W/DEVICE KIT: PACK | 0 refills | Status: AC

## 2022-05-28 MED ORDER — ACCU-CHEK GUIDE VI STRP: ORAL_STRIP | 3 refills | Status: AC

## 2022-05-28 MED ORDER — LOSARTAN POTASSIUM 100 MG PO TABS: 100.0000 mg | ORAL_TABLET | Freq: Every day | ORAL | 3 refills | Status: DC

## 2022-05-28 MED ORDER — DICLOFENAC SODIUM 75 MG PO TBEC: 75.0000 mg | DELAYED_RELEASE_TABLET | Freq: Two times a day (BID) | ORAL | 0 refills | Status: DC

## 2022-05-28 MED ORDER — HYDROCHLOROTHIAZIDE 25 MG PO TABS: 25.0000 mg | ORAL_TABLET | Freq: Every day | ORAL | 3 refills | Status: DC

## 2022-05-28 NOTE — Assessment & Plan Note (Signed)
Previously well controlled.. she has not been taking HCTZ , last took losartan 3 weeks ago.  Refilled today.

## 2022-05-28 NOTE — Patient Instructions (Addendum)
Start diclofenac 75 mg twice daily x 2 weks.  Start home PT exercise, and heat or ice on right hip.   Follow up if not improving as expected.

## 2022-05-28 NOTE — Progress Notes (Signed)
Patient ID: Dana Rogers, female    DOB: 1955/12/07, 67 y.o.   MRN: [id number]  This visit was conducted in person.  BP (!) 150/76   Pulse (!) 57   Temp (!) 97.5 F (36.4 C) (Temporal)   Ht 5' 5.5" (1.664 m)   Wt 162 lb (73.5 kg)   SpO2 97%   BMI 26.55 kg/m    CC:  Chief Complaint  Patient presents with   Hip Pain    Right x 2 weeks    Subjective:   HPI: Dana Rogers is a 67 y.o. female with history of diabetes, hypertension and chronic kidney disease presenting on 05/28/2022 for Hip Pain (Right x 2 weeks)  She has a history of chronic low back pain: No x-ray in the past.  In the past improved with home physical therapy, heat and Flexeril.  Reviewed last office visit for this issue in detail.  Today she reports new onset pain in her right hip over the last 2 weeks.  She localizes the pain to right lateral hip. Pain with lifting right leg to get in truck or with walking.  No radiation of pain.  No new numbness, no weakness.   No  pain in right buttock or low back.   No proceeding events.. no fall s or change in activity.   She has tried diclofenac she had.. helps some temporarily... 5-6 hours.  Using in last 6 days off and on.     BP up given out of losartan.  Relevant past medical, surgical, family and social history reviewed and updated as indicated. Interim medical history since our last visit reviewed. Allergies and medications reviewed and updated. Outpatient Medications Prior to Visit  Medication Sig Dispense Refill   amLODipine (NORVASC) 10 MG tablet TAKE 1 TABLET BY MOUTH EVERY DAY 90 tablet 0   atorvastatin (LIPITOR) 20 MG tablet TAKE 1 TABLET BY MOUTH EVERY DAY 90 tablet 0   cyclobenzaprine (FLEXERIL) 10 MG tablet Take 1 tablet (10 mg total) by mouth at bedtime as needed for muscle spasms. 30 tablet 1   dapagliflozin propanediol (FARXIGA) 10 MG TABS tablet TAKE 1 TABLET BY MOUTH EVERY DAY BEFORE BREAKFAST 90 tablet 0   glucose blood (ONE TOUCH ULTRA  TEST) test strip Check blood sugar twice a day and as directed.Dx E11.65 100 each 3   Lancets (ONETOUCH ULTRASOFT) lancets Check blood sugar twice a day and as directed. Dx E11.65 100 each 3   metFORMIN (GLUCOPHAGE-XR) 500 MG 24 hr tablet Take 500 mg by mouth daily with breakfast.     Blood Glucose Monitoring Suppl (ONE TOUCH ULTRA 2) w/Device KIT Check blood sugar twice a day and as directed. Dx E11.65 1 kit 0   diclofenac (VOLTAREN) 75 MG EC tablet TAKE 1 TABLET BY MOUTH TWICE A DAY 30 tablet 0   ibuprofen (ADVIL) 600 MG tablet Take 1 tablet (600 mg total) by mouth every 8 (eight) hours as needed. 30 tablet 0   losartan (COZAAR) 100 MG tablet Take 1 tablet (100 mg total) by mouth daily. 90 tablet 3   hydrochlorothiazide (HYDRODIURIL) 25 MG tablet Take 1 tablet (25 mg total) by mouth daily. (Patient not taking: Reported on 05/28/2022) 90 tablet 3   No facility-administered medications prior to visit.     Per HPI unless specifically indicated in ROS section below Review of Systems  Constitutional:  Negative for fatigue and fever.  HENT:  Negative for congestion.   Eyes:  Negative  for pain.  Respiratory:  Negative for cough and shortness of breath.   Cardiovascular:  Negative for chest pain, palpitations and leg swelling.  Gastrointestinal:  Negative for abdominal pain.  Genitourinary:  Negative for dysuria and vaginal bleeding.  Musculoskeletal:  Negative for back pain.  Neurological:  Negative for syncope, light-headedness and headaches.  Psychiatric/Behavioral:  Negative for dysphoric mood.    Objective:  BP (!) 150/76   Pulse (!) 57   Temp (!) 97.5 F (36.4 C) (Temporal)   Ht 5' 5.5" (1.664 m)   Wt 162 lb (73.5 kg)   SpO2 97%   BMI 26.55 kg/m   Wt Readings from Last 3 Encounters:  05/28/22 162 lb (73.5 kg)  01/14/22 167 lb 4 oz (75.9 kg)  12/04/21 162 lb (73.5 kg)      Physical Exam Constitutional:      General: She is not in acute distress.    Appearance: Normal  appearance. She is well-developed. She is not ill-appearing or toxic-appearing.  HENT:     Head: Normocephalic.     Right Ear: Hearing, tympanic membrane, ear canal and external ear normal. Tympanic membrane is not erythematous, retracted or bulging.     Left Ear: Hearing, tympanic membrane, ear canal and external ear normal. Tympanic membrane is not erythematous, retracted or bulging.     Nose: No mucosal edema or rhinorrhea.     Right Sinus: No maxillary sinus tenderness or frontal sinus tenderness.     Left Sinus: No maxillary sinus tenderness or frontal sinus tenderness.     Mouth/Throat:     Pharynx: Uvula midline.  Eyes:     General: Lids are normal. Lids are everted, no foreign bodies appreciated.     Conjunctiva/sclera: Conjunctivae normal.     Pupils: Pupils are equal, round, and reactive to light.  Neck:     Thyroid: No thyroid mass or thyromegaly.     Vascular: No carotid bruit.     Trachea: Trachea normal.  Cardiovascular:     Rate and Rhythm: Normal rate and regular rhythm.     Pulses: Normal pulses.     Heart sounds: Normal heart sounds, S1 normal and S2 normal. No murmur heard.    No friction rub. No gallop.  Pulmonary:     Effort: Pulmonary effort is normal. No tachypnea or respiratory distress.     Breath sounds: Normal breath sounds. No decreased breath sounds, wheezing, rhonchi or rales.  Abdominal:     General: Bowel sounds are normal.     Palpations: Abdomen is soft.     Tenderness: There is no abdominal tenderness.  Musculoskeletal:     Cervical back: Normal, normal range of motion and neck supple.     Thoracic back: Normal.     Lumbar back: Normal. No tenderness or bony tenderness. Normal range of motion. Negative right straight leg raise test and negative left straight leg raise test.     Right hip: Tenderness present. No deformity or bony tenderness. Decreased range of motion.     Left hip: Normal.     Comments: Pain lateral, ly mild decrease range of  motion of right hip.  Skin:    General: Skin is warm and dry.     Findings: No rash.  Neurological:     Mental Status: She is alert.  Psychiatric:        Mood and Affect: Mood is not anxious or depressed.        Speech: Speech normal.  Behavior: Behavior normal. Behavior is cooperative.        Thought Content: Thought content normal.        Judgment: Judgment normal.       Results for orders placed or performed in visit on 01/05/22  Comprehensive metabolic panel  Result Value Ref Range   Sodium 140 135 - 145 mEq/L   Potassium 3.6 3.5 - 5.1 mEq/L   Chloride 103 96 - 112 mEq/L   CO2 30 19 - 32 mEq/L   Glucose, Bld 134 (H) 70 - 99 mg/dL   BUN 18 6 - 23 mg/dL   Creatinine, Ser 1.03 0.40 - 1.20 mg/dL   Total Bilirubin 0.9 0.2 - 1.2 mg/dL   Alkaline Phosphatase 60 39 - 117 U/L   AST 15 0 - 37 U/L   ALT 12 0 - 35 U/L   Total Protein 7.3 6.0 - 8.3 g/dL   Albumin 4.2 3.5 - 5.2 g/dL   GFR 56.36 (L) >60.00 mL/min   Calcium 9.5 8.4 - 10.5 mg/dL  Hemoglobin A1c  Result Value Ref Range   Hgb A1c MFr Bld 7.8 (H) 4.6 - 6.5 %  Lipid panel  Result Value Ref Range   Cholesterol 153 0 - 200 mg/dL   Triglycerides 77.0 0.0 - 149.0 mg/dL   HDL 46.10 >39.00 mg/dL   VLDL 15.4 0.0 - 40.0 mg/dL   LDL Cholesterol 91 0 - 99 mg/dL   Total CHOL/HDL Ratio 3    NonHDL 106.82     Assessment and Plan  Type 2 diabetes mellitus with other circulatory complication, without long-term current use of insulin (HCC) -     Accu-Chek Guide; Check blood sugar once daily  Dispense: 100 each; Refill: 3 -     Accu-Chek Guide; Check blood sugar once daily  Dispense: 1 kit; Refill: 0  Hypertension associated with diabetes ([place]) Assessment & Plan: Previously well controlled.. she has not been taking HCTZ , last took losartan 3 weeks ago.  Refilled today.  Orders: -     Losartan Potassium; Take 1 tablet (100 mg total) by mouth daily.  Dispense: 90 tablet; Refill: 3  Acute right hip pain Assessment &  Plan: Acute, no clear indication for imaging today.  No red flags Symptoms are most consistent with right trochanteric bursitis.   Start diclofenac 75 mg twice daily x 2 weks.  Start home PT exercise, and heat or ice on right hip.   Follow up if not improving as expected.   Other orders -     hydroCHLOROthiazide; Take 1 tablet (25 mg total) by mouth daily.  Dispense: 90 tablet; Refill: 3 -     Diclofenac Sodium; Take 1 tablet (75 mg total) by mouth 2 (two) times daily.  Dispense: 30 tablet; Refill: 0    No follow-ups on file.   Eliezer Lofts, MD

## 2022-05-28 NOTE — Assessment & Plan Note (Signed)
Acute, no clear indication for imaging today.  No red flags Symptoms are most consistent with right trochanteric bursitis.   Start diclofenac 75 mg twice daily x 2 weks.  Start home PT exercise, and heat or ice on right hip.   Follow up if not improving as expected.

## 2022-06-01 LAB — HM DIABETES EYE EXAM

## 2022-06-25 ENCOUNTER — Telehealth: Payer: Self-pay | Admitting: *Deleted

## 2022-06-25 DIAGNOSIS — E1159 Type 2 diabetes mellitus with other circulatory complications: Secondary | ICD-10-CM

## 2022-06-25 NOTE — Telephone Encounter (Signed)
-----   Message from Velna Hatchet, RT sent at 06/21/2022 12:32 PM EST ----- Regarding: Thu 3/14 lab Patient is scheduled for cpx, please order future labs.  Thanks, Anda Kraft

## 2022-06-29 ENCOUNTER — Encounter: Payer: Self-pay | Admitting: Pharmacist

## 2022-06-29 ENCOUNTER — Other Ambulatory Visit: Payer: Self-pay | Admitting: Family Medicine

## 2022-07-08 ENCOUNTER — Ambulatory Visit: Payer: 59 | Admitting: Family Medicine

## 2022-07-08 ENCOUNTER — Other Ambulatory Visit (INDEPENDENT_AMBULATORY_CARE_PROVIDER_SITE_OTHER): Payer: 59

## 2022-07-08 ENCOUNTER — Encounter: Payer: Self-pay | Admitting: Family Medicine

## 2022-07-08 VITALS — BP 124/70 | HR 65 | Temp 98.2°F | Ht 65.5 in | Wt 164.0 lb

## 2022-07-08 DIAGNOSIS — J029 Acute pharyngitis, unspecified: Secondary | ICD-10-CM | POA: Diagnosis not present

## 2022-07-08 DIAGNOSIS — E1159 Type 2 diabetes mellitus with other circulatory complications: Secondary | ICD-10-CM

## 2022-07-08 LAB — COMPREHENSIVE METABOLIC PANEL
ALT: 38 U/L — ABNORMAL HIGH (ref 0–35)
AST: 27 U/L (ref 0–37)
Albumin: 4.2 g/dL (ref 3.5–5.2)
Alkaline Phosphatase: 86 U/L (ref 39–117)
BUN: 21 mg/dL (ref 6–23)
CO2: 29 mEq/L (ref 19–32)
Calcium: 9.7 mg/dL (ref 8.4–10.5)
Chloride: 102 mEq/L (ref 96–112)
Creatinine, Ser: 1.23 mg/dL — ABNORMAL HIGH (ref 0.40–1.20)
GFR: 45.39 mL/min — ABNORMAL LOW (ref >60.00)
Glucose, Bld: 159 mg/dL — ABNORMAL HIGH (ref 70–99)
Potassium: 3.2 mEq/L — ABNORMAL LOW (ref 3.5–5.1)
Sodium: 140 mEq/L (ref 135–145)
Total Bilirubin: 1 mg/dL (ref 0.2–1.2)
Total Protein: 7.5 g/dL (ref 6.0–8.3)

## 2022-07-08 LAB — HEMOGLOBIN A1C: Hgb A1c MFr Bld: 7.6 % — ABNORMAL HIGH (ref 4.6–6.5)

## 2022-07-08 LAB — MICROALBUMIN / CREATININE URINE RATIO
Creatinine,U: 124.4 mg/dL
Microalb Creat Ratio: 62.4 mg/g — ABNORMAL HIGH (ref 0.0–30.0)
Microalb, Ur: 77.6 mg/dL — ABNORMAL HIGH (ref 0.0–1.9)

## 2022-07-08 LAB — VITAMIN B12: Vitamin B-12: 470 pg/mL (ref 211–911)

## 2022-07-08 LAB — POCT RAPID STREP A (OFFICE): Rapid Strep A Screen: POSITIVE — AB

## 2022-07-08 MED ORDER — AMOXICILLIN 875 MG PO TABS: 875.0000 mg | ORAL_TABLET | Freq: Two times a day (BID) | ORAL | 0 refills | Status: AC

## 2022-07-08 NOTE — Progress Notes (Signed)
No critical labs need to be addressed urgently. We will discuss labs in detail at upcoming office visit.   

## 2022-07-08 NOTE — Patient Instructions (Signed)
Rest and fluids.  Take care.  Glad to see you. Start amoxil.  Update [place] as needed.

## 2022-07-08 NOTE — Progress Notes (Signed)
Scratchy throat and cough x 2 weeks. She has not been taking any medication; only gargling salt water and using throat lozenges.  Working at [employer].  No fevers.  No vomiting.  No diarrhea.  Voice is slightly hoarse.  No ear pain.  No facial pain.  Dry cough.  Overall not better or worse.  No wheeze.    Meds, vitals, and allergies reviewed.   ROS: Per HPI unless specifically indicated in ROS section   GEN: nad, alert and oriented HEENT: mucous membranes moist, tm w/o erythema, OP with cobblestoning NECK: supple w/o LA CV: rrr.   PULM: ctab, no inc wob EXT: no edema SKIN: well perfused.  Sinuses not ttp.   RST done at [facility].  Faintly positive.

## 2022-07-11 NOTE — Assessment & Plan Note (Signed)
Rest and fluids.  Start amoxil.  Update [place] as needed.   RST done at [facility].  Faintly positive.

## 2022-07-15 ENCOUNTER — Encounter: Payer: Self-pay | Admitting: Family Medicine

## 2022-07-15 ENCOUNTER — Ambulatory Visit (INDEPENDENT_AMBULATORY_CARE_PROVIDER_SITE_OTHER): Payer: 59 | Admitting: Family Medicine

## 2022-07-15 VITALS — BP 134/78 | HR 53 | Temp 97.3°F | Ht 65.0 in | Wt 162.1 lb

## 2022-07-15 DIAGNOSIS — E1169 Type 2 diabetes mellitus with other specified complication: Secondary | ICD-10-CM

## 2022-07-15 DIAGNOSIS — E1159 Type 2 diabetes mellitus with other circulatory complications: Secondary | ICD-10-CM

## 2022-07-15 DIAGNOSIS — E876 Hypokalemia: Secondary | ICD-10-CM

## 2022-07-15 DIAGNOSIS — E11319 Type 2 diabetes mellitus with unspecified diabetic retinopathy without macular edema: Secondary | ICD-10-CM | POA: Insufficient documentation

## 2022-07-15 DIAGNOSIS — E785 Hyperlipidemia, unspecified: Secondary | ICD-10-CM

## 2022-07-15 DIAGNOSIS — R001 Bradycardia, unspecified: Secondary | ICD-10-CM | POA: Insufficient documentation

## 2022-07-15 DIAGNOSIS — E2839 Other primary ovarian failure: Secondary | ICD-10-CM

## 2022-07-15 DIAGNOSIS — Z0000 Encounter for general adult medical examination without abnormal findings: Secondary | ICD-10-CM

## 2022-07-15 DIAGNOSIS — N1831 Chronic kidney disease, stage 3a: Secondary | ICD-10-CM

## 2022-07-15 DIAGNOSIS — I152 Hypertension secondary to endocrine disorders: Secondary | ICD-10-CM

## 2022-07-15 LAB — HM DIABETES FOOT EXAM

## 2022-07-15 LAB — POTASSIUM: Potassium: 3.7 mEq/L (ref 3.5–5.1)

## 2022-07-15 LAB — TSH: TSH: 0.86 u[IU]/mL (ref 0.35–5.50)

## 2022-07-15 NOTE — Assessment & Plan Note (Signed)
Appears chronic.  Present when she saw Dr. Percival Spanish in 2019 with heart rate of 57. Patient with symptomatic and given she is following her blood pressure more closely and has noted it stays between 50-60. Will evaluate for thyroid issue today.  Most likely secondary to amlodipine but given his he is asymptomatic and I do not see a clear reason we need to decrease this.  Consider follow-up with cardiology if patient continues to be concerned.

## 2022-07-15 NOTE — Progress Notes (Signed)
Patient ID: Dana Rogers, female    DOB: 1955-10-28, 66 y.o.   MRN: [id number]  This visit was conducted in person.  BP 134/78   Pulse (!) 53   Temp (!) 97.3 F (36.3 C) (Temporal)   Ht 5\' 5"  (1.651 m)   Wt 162 lb 2 oz (73.5 kg)   SpO2 96%   BMI 26.98 kg/m    CC: Chief Complaint  Patient presents with   Medicare Wellness    Subjective:   HPI: Dana Rogers is a 67 y.o. female presenting on 07/15/2022 for Medicare Wellness  The patient presents for annual medicare wellness, complete physical and review of chronic health problems. He/She also has the following acute concerns today:   Strep infection improved/resolved with Amox.  I have personally reviewed the Medicare Annual Wellness questionnaire and have noted 1. The patient's medical and social history 2. Their use of alcohol, tobacco or illicit drugs 3. Their current medications and supplements 4. The patient's functional ability including ADL's, fall risks, home safety risks and hearing or visual             impairment. 5. Diet and physical activities 6. Evidence for depression or mood disorders 7.         Updated provider list Cognitive evaluation was performed and recorded on pt medicare questionnaire form. The patients weight, height, BMI and visual acuity have been recorded in the chart   I have made referrals, counseling and provided education to the patient based review of the above and I have provided the pt with a written personalized care plan for preventive services.   Documentation of this information was scanned into the electronic record under the media tab.   Advance directives and end of life planning reviewed in detail with patient and documented in EMR. Patient given handout on advance care directives if needed. HCPOA and living will updated if needed.  No falls in last 12 months.   [place] [facility] Visit from 07/15/2022 in [place] at [facility]  PHQ-2 Total Score  0      Hypertension:   Good control on amlodipine , HCTZ and losartan. BP Readings from Last 3 Encounters:  07/15/22 134/78  07/08/22 124/70  05/28/22 (!) 150/76  Using medication without problems or lightheadedness:  none Chest pain with exertion: none Edema: occ Short of breath: none Average home BPs: 130/70 Other issues: heart rate when she is checking 50-60.Marland Kitchen not symptomatic.  Elevated Cholesterol Well controlled last OV.:LDL at goal < 100 on atorvastatin 20 mg daily Lab Results  Component Value Date   CHOL 153 01/05/2022   HDL 46.10 01/05/2022   LDLCALC 91 01/05/2022   TRIG 77.0 01/05/2022   CHOLHDL 3 01/05/2022  Using medications without problems:none Muscle aches:  none Diet compliance: low carb, decreased sweet tea. Exercise:  none Other complaints:  Diabetes:   Improved control  but not yet at goal on Farxiga 10 mg, metformin XR 500 mg daily ( was off it for a while, back on in last week) Lab Results  Component Value Date   HGBA1C 7.6 (H) 07/08/2022  Using medications without difficulties: Hypoglycemic episodes:none Hyperglycemic episodes: none Feet problems: no ulcers Blood Sugars averaging: not checking regularly eye exam within last year: done  Wt Readings from Last 3 Encounters:  07/15/22 162 lb 2 oz (73.5 kg)  07/08/22 164 lb (74.4 kg)  05/28/22 162 lb (73.5 kg)   Body mass index is 26.98 kg/m.  Relevant past medical, surgical, family and social history reviewed and updated as indicated. Interim medical history since our last visit reviewed. Allergies and medications reviewed and updated. Outpatient Medications Prior to Visit  Medication Sig Dispense Refill   amLODipine (NORVASC) 10 MG tablet TAKE 1 TABLET BY MOUTH EVERY DAY 90 tablet 0   amoxicillin (AMOXIL) 875 MG tablet Take 1 tablet (875 mg total) by mouth 2 (two) times daily for 10 days. 20 tablet 0   atorvastatin (LIPITOR) 20 MG tablet TAKE 1 TABLET BY MOUTH EVERY DAY 90 tablet 0    Blood Glucose Monitoring Suppl (ACCU-CHEK GUIDE) w/Device KIT Check blood sugar once daily 1 kit 0   cyclobenzaprine (FLEXERIL) 10 MG tablet Take 1 tablet (10 mg total) by mouth at bedtime as needed for muscle spasms. 30 tablet 1   dapagliflozin propanediol (FARXIGA) 10 MG TABS tablet TAKE 1 TABLET BY MOUTH EVERY DAY BEFORE BREAKFAST 90 tablet 0   diclofenac (VOLTAREN) 75 MG EC tablet Take 1 tablet (75 mg total) by mouth 2 (two) times daily. 30 tablet 0   glucose blood (ACCU-CHEK GUIDE) test strip Check blood sugar once daily 100 each 3   glucose blood (ONE TOUCH ULTRA TEST) test strip Check blood sugar twice a day and as directed.Dx E11.65 100 each 3   hydrochlorothiazide (HYDRODIURIL) 25 MG tablet Take 1 tablet (25 mg total) by mouth daily. 90 tablet 3   Lancets (ONETOUCH ULTRASOFT) lancets Check blood sugar twice a day and as directed. Dx E11.65 100 each 3   losartan (COZAAR) 100 MG tablet Take 1 tablet (100 mg total) by mouth daily. 90 tablet 3   metFORMIN (GLUCOPHAGE-XR) 500 MG 24 hr tablet Take 1 tablet (500 mg total) by mouth daily with breakfast. 90 tablet 0   No facility-administered medications prior to visit.     Per HPI unless specifically indicated in ROS section below Review of Systems  Constitutional:  Negative for fatigue and fever.  HENT:  Negative for congestion.   Eyes:  Negative for pain.  Respiratory:  Negative for cough and shortness of breath.   Cardiovascular:  Negative for chest pain, palpitations and leg swelling.  Gastrointestinal:  Negative for abdominal pain.  Genitourinary:  Negative for dysuria and vaginal bleeding.  Musculoskeletal:  Negative for back pain.  Neurological:  Negative for syncope, light-headedness and headaches.  Psychiatric/Behavioral:  Negative for dysphoric mood.    Objective:  BP 134/78   Pulse (!) 53   Temp (!) 97.3 F (36.3 C) (Temporal)   Ht 5\' 5"  (1.651 m)   Wt 162 lb 2 oz (73.5 kg)   SpO2 96%   BMI 26.98 kg/m   Wt Readings  from Last 3 Encounters:  07/15/22 162 lb 2 oz (73.5 kg)  07/08/22 164 lb (74.4 kg)  05/28/22 162 lb (73.5 kg)      Physical Exam Vitals and nursing note reviewed.  Constitutional:      General: She is not in acute distress.    Appearance: Normal appearance. She is well-developed. She is not ill-appearing or toxic-appearing.  HENT:     Head: Normocephalic.     Right Ear: Hearing, tympanic membrane, ear canal and external ear normal.     Left Ear: Hearing, tympanic membrane, ear canal and external ear normal.     Nose: Nose normal.  Eyes:     General: Lids are normal. Lids are everted, no foreign bodies appreciated.     Conjunctiva/sclera: Conjunctivae normal.     Pupils:  Pupils are equal, round, and reactive to light.  Neck:     Thyroid: No thyroid mass or thyromegaly.     Vascular: No carotid bruit.     Trachea: Trachea normal.  Cardiovascular:     Rate and Rhythm: Normal rate and regular rhythm.     Heart sounds: Normal heart sounds, S1 normal and S2 normal. No murmur heard.    No gallop.  Pulmonary:     Effort: Pulmonary effort is normal. No respiratory distress.     Breath sounds: Normal breath sounds. No wheezing, rhonchi or rales.  Abdominal:     General: Bowel sounds are normal. There is no distension or abdominal bruit.     Palpations: Abdomen is soft. There is no fluid wave or mass.     Tenderness: There is no abdominal tenderness. There is no guarding or rebound.     Hernia: No hernia is present.  Musculoskeletal:     Cervical back: Normal range of motion and neck supple.  Lymphadenopathy:     Cervical: No cervical adenopathy.  Skin:    General: Skin is warm and dry.     Findings: No rash.  Neurological:     Mental Status: She is alert.     Cranial Nerves: No cranial nerve deficit.     Sensory: No sensory deficit.  Psychiatric:        Mood and Affect: Mood is not anxious or depressed.        Speech: Speech normal.        Behavior: Behavior normal. Behavior  is cooperative.        Judgment: Judgment normal.     Diabetic foot exam: Normal inspection No skin breakdown No calluses  Normal DP pulses Normal sensation to light touch and monofilament Nails normal     Results for orders placed or performed in visit on 07/15/22  HM DIABETES FOOT EXAM  Result Value Ref Range   HM Diabetic Foot Exam done     This visit occurred during the SARS-CoV-2 public health emergency.  Safety protocols were in place, including screening questions prior to the visit, additional usage of staff PPE, and extensive cleaning of exam room while observing appropriate contact time as indicated for disinfecting solutions.   COVID 19 screen:  No recent travel or known exposure to COVID19 The patient denies respiratory symptoms of COVID 19 at this time. The importance of social distancing was discussed today.   Assessment and Plan   The patient's preventative maintenance and recommended screening tests for an annual wellness exam were reviewed in full today. Brought up to date unless services declined.  Counselled on the importance of diet, exercise, and its role in overall health and mortality. The patient's FH and SH was reviewed, including their home life, tobacco status, and drug and alcohol status.   Last PAP nml 2010, 2012..high risk HPV 05/2012, high risk HPV 08/2013,  And again in 2016 .Marland Kitchen Referred to Dr. Nori Riis GYN, neg colposcopy..  2018 neg pap, neg HPV.... no further indicated given age. No family history of ovarian or uterine cancer. Mammo 2020.. repeat due 03/2021 DUE Colon cancer screening: Could not tolerate golytely in past, 2023 cologuard.. repeat in 2026 Vaccines up to date with Tdap, PNA given  HD flu vaccine.   Due for shingles.  DEXA: no early osteo in family.. Plan start 64.   DUE Hep C screening done  HIV: done  Microalbumin : done on Farxiga Problem List Items Addressed This Visit  Bradycardia     Appears chronic.  Present when she  saw Dr. Percival Spanish in 2019 with heart rate of 57. Patient with symptomatic and given she is following her blood pressure more closely and has noted it stays between 50-60. Will evaluate for thyroid issue today.  Most likely secondary to amlodipine but given his he is asymptomatic and I do not see a clear reason we need to decrease this.  Consider follow-up with cardiology if patient continues to be concerned.      Relevant Orders   TSH   CKD stage G3a/A2, GFR 45-59 and albumin creatinine ratio 30-299 mg/g (HCC)    Chronic, associated with diabetes. Microalbuminuria present greater than 30  On ARB and SGLT2.  Some worsening of GFR in the last 6 months.  Discussed increasing water and possible referral to kidney specialist      Diabetes mellitus with circulatory complication, Hypertension ([place])    CHRONIC Inadequate control despite metformin 500 mg XL once daily and Farxiga 10 mg daily    Cannot tolerate higher dose of metformin.      Hyperlipidemia associated with type 2 diabetes mellitus ([place])    Well-controlled on atorvastatin 20 mg p.o. daily      Hypertension associated with diabetes ([place])    Stable, chronic.  Continue current medication.    amlodipine 10 mg daily, HCTZ 25 mg daily and losartan 100 mg daily.      Other Visit Diagnoses     Medicare annual wellness visit, subsequent    -  Primary   Hypokalemia       Relevant Orders   Potassium   Estrogen deficiency       Relevant Orders   DG Bone Density      Eliezer Lofts, MD

## 2022-07-15 NOTE — Assessment & Plan Note (Signed)
CHRONIC Inadequate control despite metformin 500 mg XL once daily and Farxiga 10 mg daily    Cannot tolerate higher dose of metformin.

## 2022-07-15 NOTE — Assessment & Plan Note (Signed)
Well-controlled on atorvastatin 20 mg p.o. daily

## 2022-07-15 NOTE — Patient Instructions (Addendum)
Please stop at the lab to have labs drawn.   Continue with Wilder Glade and metformin.. call if not tolerating.  Increase potassium in diet.  Please call the location of your choice from the menu below to schedule your Mammogram and/or Bone Density appointment.    [facility]                      Phone:  [phone], [place] [address]                                                             Services: Traditional and 3D Mammogram, [facility]                 Phone: [phone] [address], [place] [address]    Service: Bone Density ONLY   *this site does NOT perform mammograms  [facility]                        Phone:  [phone] [address]                                  [place], [place] [address]                                            Services:  3D Mammogram and Bone Density

## 2022-07-15 NOTE — Assessment & Plan Note (Signed)
Chronic, associated with diabetes. Microalbuminuria present greater than 30  On ARB and SGLT2.  Some worsening of GFR in the last 6 months.  Discussed increasing water and possible referral to kidney specialist

## 2022-07-15 NOTE — Assessment & Plan Note (Signed)
Stable, chronic.  Continue current medication.    amlodipine 10 mg daily, HCTZ 25 mg daily and losartan 100 mg daily.

## 2022-08-22 ENCOUNTER — Other Ambulatory Visit: Payer: Self-pay | Admitting: Family Medicine

## 2022-09-16 ENCOUNTER — Other Ambulatory Visit: Payer: Self-pay | Admitting: Family Medicine

## 2022-09-16 NOTE — Telephone Encounter (Signed)
Last office visit 07/15/2022 for MWV.  Last refilled 06/15/2022 for #30 with no refills.  Next Appt: No future appointments.

## 2022-10-19 ENCOUNTER — Other Ambulatory Visit: Payer: Self-pay | Admitting: Family Medicine

## 2022-10-19 NOTE — Telephone Encounter (Signed)
Last office visit 07/15/2022 for MWV.  Last refilled 09/16/2022 for #30 with no refills.  Next appt: No future appointments.

## 2023-01-22 ENCOUNTER — Other Ambulatory Visit: Payer: Self-pay | Admitting: Family Medicine

## 2023-01-24 NOTE — Telephone Encounter (Signed)
LOV: 07/15/22 for MWV  Last refill: 10/19/22 #30 w/ 0 refills   NOV: not schdeuled

## 2023-03-30 ENCOUNTER — Other Ambulatory Visit: Payer: Self-pay | Admitting: Family Medicine

## 2023-04-05 ENCOUNTER — Ambulatory Visit (INDEPENDENT_AMBULATORY_CARE_PROVIDER_SITE_OTHER): Payer: 59 | Admitting: Family Medicine

## 2023-04-05 ENCOUNTER — Encounter: Payer: Self-pay | Admitting: Family Medicine

## 2023-04-05 VITALS — BP 110/60 | HR 64 | Temp 98.0°F | Ht 65.0 in | Wt 165.1 lb

## 2023-04-05 DIAGNOSIS — E11319 Type 2 diabetes mellitus with unspecified diabetic retinopathy without macular edema: Secondary | ICD-10-CM

## 2023-04-05 DIAGNOSIS — E785 Hyperlipidemia, unspecified: Secondary | ICD-10-CM

## 2023-04-05 DIAGNOSIS — Z23 Encounter for immunization: Secondary | ICD-10-CM | POA: Diagnosis not present

## 2023-04-05 DIAGNOSIS — Z7984 Long term (current) use of oral hypoglycemic drugs: Secondary | ICD-10-CM

## 2023-04-05 DIAGNOSIS — E1169 Type 2 diabetes mellitus with other specified complication: Secondary | ICD-10-CM | POA: Diagnosis not present

## 2023-04-05 DIAGNOSIS — I152 Hypertension secondary to endocrine disorders: Secondary | ICD-10-CM

## 2023-04-05 DIAGNOSIS — E1159 Type 2 diabetes mellitus with other circulatory complications: Secondary | ICD-10-CM | POA: Diagnosis not present

## 2023-04-05 DIAGNOSIS — R0982 Postnasal drip: Secondary | ICD-10-CM | POA: Diagnosis not present

## 2023-04-05 LAB — POCT GLYCOSYLATED HEMOGLOBIN (HGB A1C): Hemoglobin A1C: 8.5 % — AB (ref 4.0–5.6)

## 2023-04-05 MED ORDER — ATORVASTATIN CALCIUM 20 MG PO TABS: 20.0000 mg | ORAL_TABLET | Freq: Every day | ORAL | 3 refills | Status: AC

## 2023-04-05 NOTE — Assessment & Plan Note (Signed)
Acute but ongoing 2 to 3 months, no signs and symptoms of viral or bacterial infection.  Possible post infectious versus allergic. Treat with Flonase 2 sprays per nostril daily and start Zyrtec at bedtime.  Return and ER precautions provided.

## 2023-04-05 NOTE — Assessment & Plan Note (Signed)
Stable, chronic.  Continue current medication.    amlodipine 10 mg daily, HCTZ 25 mg daily and losartan 100 mg daily.

## 2023-04-05 NOTE — Patient Instructions (Addendum)
Flonase 2 sprays per nostril daily.  Start zyrtec at bedtime  Call for follow up/re-eval  if fever, face pain, ear pain  or shortness of breath.  Work on Holiday representative  and low carb diet.

## 2023-04-05 NOTE — Progress Notes (Signed)
Patient ID: Dana Rogers, female    DOB: [birth year], 67 y.o.   MRN: [id number]  This visit was conducted in person.  BP 110/60 (BP Location: Right Arm, Patient Position: Sitting, Cuff Size: Large)   Pulse 64   Temp 98 F (36.7 C) (Temporal)   Ht 5\' 5"  (1.651 m)   Wt 165 lb 2 oz (74.9 kg)   SpO2 94%   BMI 27.48 kg/m    CC:  Chief Complaint  Patient presents with   Nasal Congestion    Phlegm in throat   Diabetes    Elevated BS but was out of her [facility] but restarted it on Saturday    Subjective:   HPI: Dana Rogers is a 67 y.o. female presenting on 04/05/2023 for Nasal Congestion (Phlegm in throat) and Diabetes (Elevated BS but was out of her [employer] but restarted it on Saturday)   She reports increased  congestion in last 2-3 months, post nasal drip, having to clear throat.  Occ cough.... initially, none now.  No fever, no SOB.  No Face pain, no ST, no ear pain.  Has tried mucinex, hot tea.   She had been out of [place]  ( for 6 weeks, issues at pharmacy and got frustrated, no SE) and glucose was high  ( FBS 220)... now back on 4 days a go This AM FBS 177 Also taking metformin  Moderate  diet, minimal exercise.Marland Kitchen active at work.   Lab Results  Component Value Date   HGBA1C 8.5 (A) 04/05/2023   Hypertension:   Well controled in office today. amlodipine 10 mg daily, HCTZ 25 mg daily and losartan 100 mg daily.  BP Readings from Last 3 Encounters:  04/05/23 110/60  07/15/22 134/78  07/08/22 124/70  Using medication without problems or lightheadedness:  Chest pain with exertion: Edema: Short of breath: Average home BPs: Other issues:  Has been out of cholesterol med... re-eval in 3 months  Wt Readings from Last 3 Encounters:  04/05/23 165 lb 2 oz (74.9 kg)  07/15/22 162 lb 2 oz (73.5 kg)  07/08/22 164 lb (74.4 kg)    Relevant past medical, surgical, family and social history reviewed and updated as indicated. Interim medical history since our last  visit reviewed. Allergies and medications reviewed and updated. Outpatient Medications Prior to Visit  Medication Sig Dispense Refill   amLODipine (NORVASC) 10 MG tablet TAKE 1 TABLET BY MOUTH EVERY DAY 90 tablet 0   Blood Glucose Monitoring Suppl (ACCU-CHEK GUIDE) w/Device KIT Check blood sugar once daily 1 kit 0   cyclobenzaprine (FLEXERIL) 10 MG tablet Take 1 tablet (10 mg total) by mouth at bedtime as needed for muscle spasms. 30 tablet 1   dapagliflozin propanediol (FARXIGA) 10 MG TABS tablet TAKE 1 TABLET BY MOUTH EVERY DAY BEFORE BREAKFAST 90 tablet 1   diclofenac (VOLTAREN) 75 MG EC tablet TAKE 1 TABLET BY MOUTH TWICE A DAY 30 tablet 0   glucose blood (ACCU-CHEK GUIDE) test strip Check blood sugar once daily 100 each 3   glucose blood (ONE TOUCH ULTRA TEST) test strip Check blood sugar twice a day and as directed.Dx E11.65 100 each 3   hydrochlorothiazide (HYDRODIURIL) 25 MG tablet Take 1 tablet (25 mg total) by mouth daily. 90 tablet 3   Lancets (ONETOUCH ULTRASOFT) lancets Check blood sugar twice a day and as directed. Dx E11.65 100 each 3   losartan (COZAAR) 100 MG tablet Take 1 tablet (100 mg total) by mouth daily.  90 tablet 3   metFORMIN (GLUCOPHAGE-XR) 500 MG 24 hr tablet TAKE 1 TABLET BY MOUTH EVERY DAY WITH BREAKFAST 90 tablet 0   atorvastatin (LIPITOR) 20 MG tablet TAKE 1 TABLET BY MOUTH EVERY DAY 90 tablet 1   No facility-administered medications prior to visit.     Per HPI unless specifically indicated in ROS section below Review of Systems  Constitutional:  Negative for fatigue and fever.  HENT:  Negative for congestion.   Eyes:  Negative for pain.  Respiratory:  Negative for cough and shortness of breath.   Cardiovascular:  Negative for chest pain, palpitations and leg swelling.  Gastrointestinal:  Negative for abdominal pain.  Genitourinary:  Negative for dysuria and vaginal bleeding.  Musculoskeletal:  Negative for back pain.  Neurological:  Negative for syncope,  light-headedness and headaches.  Psychiatric/Behavioral:  Negative for dysphoric mood.    Objective:  BP 110/60 (BP Location: Right Arm, Patient Position: Sitting, Cuff Size: Large)   Pulse 64   Temp 98 F (36.7 C) (Temporal)   Ht 5\' 5"  (1.651 m)   Wt 165 lb 2 oz (74.9 kg)   SpO2 94%   BMI 27.48 kg/m   Wt Readings from Last 3 Encounters:  04/05/23 165 lb 2 oz (74.9 kg)  07/15/22 162 lb 2 oz (73.5 kg)  07/08/22 164 lb (74.4 kg)      Physical Exam Constitutional:      General: She is not in acute distress.    Appearance: Normal appearance. She is well-developed. She is not ill-appearing or toxic-appearing.  HENT:     Head: Normocephalic.     Right Ear: Hearing, tympanic membrane, ear canal and external ear normal. Tympanic membrane is not erythematous, retracted or bulging.     Left Ear: Hearing, tympanic membrane, ear canal and external ear normal. Tympanic membrane is not erythematous, retracted or bulging.     Nose: No mucosal edema or rhinorrhea.     Right Sinus: No maxillary sinus tenderness or frontal sinus tenderness.     Left Sinus: No maxillary sinus tenderness or frontal sinus tenderness.     Mouth/Throat:     Mouth: Oropharynx is clear and moist and mucous membranes are normal.     Pharynx: Uvula midline.  Eyes:     General: Lids are normal. Lids are everted, no foreign bodies appreciated.     Extraocular Movements: EOM normal.     Conjunctiva/sclera: Conjunctivae normal.     Pupils: Pupils are equal, round, and reactive to light.  Neck:     Thyroid: No thyroid mass or thyromegaly.     Vascular: No carotid bruit.     Trachea: Trachea normal.  Cardiovascular:     Rate and Rhythm: Normal rate and regular rhythm.     Pulses: Normal pulses.     Heart sounds: Normal heart sounds, S1 normal and S2 normal. No murmur heard.    No friction rub. No gallop.  Pulmonary:     Effort: Pulmonary effort is normal. No tachypnea or respiratory distress.     Breath sounds:  Normal breath sounds. No decreased breath sounds, wheezing, rhonchi or rales.  Abdominal:     General: Bowel sounds are normal.     Palpations: Abdomen is soft.     Tenderness: There is no abdominal tenderness.  Musculoskeletal:     Cervical back: Normal range of motion and neck supple.  Skin:    General: Skin is warm, dry and intact.     Findings: No  rash.  Neurological:     Mental Status: She is alert.  Psychiatric:        Mood and Affect: Mood is not anxious or depressed.        Speech: Speech normal.        Behavior: Behavior normal. Behavior is cooperative.        Thought Content: Thought content normal.        Cognition and Memory: Cognition and memory normal.        Judgment: Judgment normal.       Results for orders placed or performed in visit on 04/05/23  POCT glycosylated hemoglobin (Hb A1C)  Result Value Ref Range   Hemoglobin A1C 8.5 (A) 4.0 - 5.6 %   HbA1c POC (<> result, manual entry)     HbA1c, POC (prediabetic range)     HbA1c, POC (controlled diabetic range)      Assessment and Plan  Type 2 diabetes mellitus with other circulatory complication, without long-term current use of insulin (HCC) -     POCT glycosylated hemoglobin (Hb A1C)  Post-nasal drip Assessment & Plan: Acute but ongoing 2 to 3 months, no signs and symptoms of viral or bacterial infection.  Possible post infectious versus allergic. Treat with Flonase 2 sprays per nostril daily and start Zyrtec at bedtime.  Return and ER precautions provided.   Hypertension associated with diabetes (HCC) Assessment & Plan: Stable, chronic.  Continue current medication.    amlodipine 10 mg daily, HCTZ 25 mg daily and losartan 100 mg daily.   Hyperlipidemia associated with type 2 diabetes mellitus (HCC) Assessment & Plan: Chronic, previously well-controlled  Sent in refill of atorvastatin 10 mg p.o. daily.  Reevaluate in 3 months.   Need for influenza vaccination -     Flu Vaccine Trivalent  High Dose (Fluad)  Type 2 diabetes mellitus with both eyes affected by retinopathy without macular edema, without long-term current use of insulin, unspecified retinopathy severity (HCC) Assessment & Plan: Chronic,Inadequate control  recently given out of meds for 6 weeks, now back on. Encouraged regular exercise such as daily walking and low carbohydrate diet. We will reevaluate in 3 months with repeat A1c after back on medication.  May need to add additional medicine as metformin at max tolerated dose.   Metformin 500 mg XL once daily and Farxiga 10 mg daily   Cannot tolerate higher dose of metformin.    Other orders -     Atorvastatin Calcium; Take 1 tablet (20 mg total) by mouth daily.  Dispense: 90 tablet; Refill: 3    Return for phone AMW,  fasting labs then CPE with me.   Kerby Nora, MD

## 2023-04-05 NOTE — Assessment & Plan Note (Addendum)
Chronic,Inadequate control  recently given out of meds for 6 weeks, now back on. Encouraged regular exercise such as daily walking and low carbohydrate diet. We will reevaluate in 3 months with repeat A1c after back on medication.  May need to add additional medicine as metformin at max tolerated dose.   Metformin 500 mg XL once daily and Farxiga 10 mg daily   Cannot tolerate higher dose of metformin.

## 2023-04-05 NOTE — Assessment & Plan Note (Signed)
Chronic, previously well-controlled  Sent in refill of atorvastatin 10 mg p.o. daily.  Reevaluate in 3 months.

## 2023-05-30 ENCOUNTER — Other Ambulatory Visit: Payer: Self-pay

## 2023-05-30 DIAGNOSIS — E2839 Other primary ovarian failure: Secondary | ICD-10-CM

## 2023-05-30 DIAGNOSIS — Z1231 Encounter for screening mammogram for malignant neoplasm of breast: Secondary | ICD-10-CM

## 2023-06-09 ENCOUNTER — Other Ambulatory Visit: Payer: Medicare Other

## 2023-06-09 ENCOUNTER — Inpatient Hospital Stay: Admission: RE | Admit: 2023-06-09 | Payer: Medicare Other | Source: Ambulatory Visit

## 2023-06-15 ENCOUNTER — Other Ambulatory Visit: Payer: Medicare Other

## 2023-06-15 ENCOUNTER — Inpatient Hospital Stay: Admission: RE | Admit: 2023-06-15 | Payer: Medicare Other | Source: Ambulatory Visit

## 2023-06-16 ENCOUNTER — Other Ambulatory Visit: Payer: Self-pay | Admitting: Family Medicine

## 2023-06-16 DIAGNOSIS — E1159 Type 2 diabetes mellitus with other circulatory complications: Secondary | ICD-10-CM

## 2023-06-16 DIAGNOSIS — E1169 Type 2 diabetes mellitus with other specified complication: Secondary | ICD-10-CM

## 2023-06-23 ENCOUNTER — Ambulatory Visit
Admission: RE | Admit: 2023-06-23 | Discharge: 2023-06-23 | Disposition: A | Discharge status: Home / Self Care | Payer: Medicare Other | Source: Ambulatory Visit | Attending: Family Medicine | Admitting: Family Medicine

## 2023-06-23 ENCOUNTER — Encounter: Payer: Self-pay | Admitting: Family Medicine

## 2023-06-23 DIAGNOSIS — Z1231 Encounter for screening mammogram for malignant neoplasm of breast: Secondary | ICD-10-CM | POA: Diagnosis present

## 2023-06-23 DIAGNOSIS — E2839 Other primary ovarian failure: Secondary | ICD-10-CM

## 2023-06-23 DIAGNOSIS — M810 Age-related osteoporosis without current pathological fracture: Secondary | ICD-10-CM | POA: Insufficient documentation

## 2023-07-04 ENCOUNTER — Other Ambulatory Visit: Payer: Medicare Other

## 2023-07-04 ENCOUNTER — Telehealth: Payer: Self-pay

## 2023-07-04 NOTE — Telephone Encounter (Signed)
 Patient was identified as falling into the [place] Measure - Diabetes.   Patient was: Appointment scheduled for lab or office visit for A1c.

## 2023-07-05 ENCOUNTER — Other Ambulatory Visit (INDEPENDENT_AMBULATORY_CARE_PROVIDER_SITE_OTHER)

## 2023-07-05 ENCOUNTER — Encounter: Payer: Self-pay | Admitting: Family Medicine

## 2023-07-05 DIAGNOSIS — E1169 Type 2 diabetes mellitus with other specified complication: Secondary | ICD-10-CM

## 2023-07-05 DIAGNOSIS — E1159 Type 2 diabetes mellitus with other circulatory complications: Secondary | ICD-10-CM

## 2023-07-05 DIAGNOSIS — E785 Hyperlipidemia, unspecified: Secondary | ICD-10-CM | POA: Diagnosis not present

## 2023-07-05 LAB — LIPID PANEL
Cholesterol: 133 mg/dL (ref 0–200)
HDL: 39.5 mg/dL (ref >39.00)
LDL Cholesterol: 77 mg/dL (ref 0–99)
NonHDL: 93.9
Total CHOL/HDL Ratio: 3
Triglycerides: 87 mg/dL (ref 0.0–149.0)
VLDL: 17.4 mg/dL (ref 0.0–40.0)

## 2023-07-05 LAB — COMPREHENSIVE METABOLIC PANEL
ALT: 14 U/L (ref 0–35)
AST: 16 U/L (ref 0–37)
Albumin: 4.7 g/dL (ref 3.5–5.2)
Alkaline Phosphatase: 56 U/L (ref 39–117)
BUN: 17 mg/dL (ref 6–23)
CO2: 29 meq/L (ref 19–32)
Calcium: 9.6 mg/dL (ref 8.4–10.5)
Chloride: 107 meq/L (ref 96–112)
Creatinine, Ser: 1.01 mg/dL (ref 0.40–1.20)
GFR: 57.1 mL/min — ABNORMAL LOW (ref >60.00)
Glucose, Bld: 120 mg/dL — ABNORMAL HIGH (ref 70–99)
Potassium: 3.7 meq/L (ref 3.5–5.1)
Sodium: 143 meq/L (ref 135–145)
Total Bilirubin: 0.9 mg/dL (ref 0.2–1.2)
Total Protein: 7.3 g/dL (ref 6.0–8.3)

## 2023-07-05 LAB — MICROALBUMIN / CREATININE URINE RATIO
Creatinine,U: 78.9 mg/dL
Microalb Creat Ratio: 91.2 mg/g — ABNORMAL HIGH (ref 0.0–30.0)
Microalb, Ur: 7.2 mg/dL — ABNORMAL HIGH (ref 0.0–1.9)

## 2023-07-05 LAB — HEMOGLOBIN A1C: Hgb A1c MFr Bld: 7.4 % — ABNORMAL HIGH (ref 4.6–6.5)

## 2023-07-05 NOTE — Progress Notes (Signed)
 No critical labs need to be addressed urgently. We will discuss labs in detail at upcoming office visit.

## 2023-07-06 ENCOUNTER — Encounter: Payer: Medicare Other | Admitting: Family Medicine

## 2023-07-08 NOTE — Telephone Encounter (Signed)
 Patient was identified as falling into the [place] Measure - Diabetes.   Patient was: Appointment scheduled with primary care provider in the next 30 days.

## 2023-07-20 ENCOUNTER — Ambulatory Visit: Admitting: Family Medicine

## 2023-07-20 ENCOUNTER — Encounter: Payer: Self-pay | Admitting: Family Medicine

## 2023-07-20 VITALS — BP 128/82 | HR 55 | Temp 98.6°F | Ht 65.0 in | Wt 162.0 lb

## 2023-07-20 DIAGNOSIS — E11319 Type 2 diabetes mellitus with unspecified diabetic retinopathy without macular edema: Secondary | ICD-10-CM

## 2023-07-20 DIAGNOSIS — R809 Proteinuria, unspecified: Secondary | ICD-10-CM

## 2023-07-20 DIAGNOSIS — R001 Bradycardia, unspecified: Secondary | ICD-10-CM

## 2023-07-20 DIAGNOSIS — I152 Hypertension secondary to endocrine disorders: Secondary | ICD-10-CM

## 2023-07-20 DIAGNOSIS — E1169 Type 2 diabetes mellitus with other specified complication: Secondary | ICD-10-CM

## 2023-07-20 DIAGNOSIS — E1159 Type 2 diabetes mellitus with other circulatory complications: Secondary | ICD-10-CM | POA: Diagnosis not present

## 2023-07-20 DIAGNOSIS — E785 Hyperlipidemia, unspecified: Secondary | ICD-10-CM

## 2023-07-20 DIAGNOSIS — Z0000 Encounter for general adult medical examination without abnormal findings: Secondary | ICD-10-CM | POA: Diagnosis not present

## 2023-07-20 DIAGNOSIS — M810 Age-related osteoporosis without current pathological fracture: Secondary | ICD-10-CM

## 2023-07-20 DIAGNOSIS — N1831 Chronic kidney disease, stage 3a: Secondary | ICD-10-CM

## 2023-07-20 LAB — HM DIABETES FOOT EXAM

## 2023-07-20 MED ORDER — OZEMPIC (0.25 OR 0.5 MG/DOSE) 2 MG/3ML ~~LOC~~ SOPN: 0.2500 mg | PEN_INJECTOR | SUBCUTANEOUS | 11 refills | Status: AC

## 2023-07-20 NOTE — Assessment & Plan Note (Signed)
 Recommend weight bearing exercise, calcium in diet and vit D supplement 400 IU 1-2 times daily. She will hold off on starting drug therapy at this time per pt request. Will re-eval in 2 years with DEXA.

## 2023-07-20 NOTE — Assessment & Plan Note (Signed)
Yearly eye exam recommended.

## 2023-07-20 NOTE — Assessment & Plan Note (Signed)
 Chronic, Improved control Encouraged regular exercise such as daily walking and low carbohydrate diet. We will reevaluate in 3 months with repeat A1c after back on medication.  May need to add additional medicine as metformin at max tolerated dose.   Metformin 500 mg XL once daily and Farxiga 10 mg daily   Cannot tolerate higher dose of metformin.

## 2023-07-20 NOTE — Assessment & Plan Note (Signed)
 Chronic, She does have some associated fatigue now, no lightheadedness.  Likely due to amlodipine, but she is at high risk for heart disease... will refer her back to Cardiology.

## 2023-07-20 NOTE — Progress Notes (Signed)
 Patient ID: Dana Rogers, female    DOB: 21-Jun-1955, 68 y.o.   MRN: [id number]  This visit was conducted in person.  BP 128/82   Pulse (!) 55   Temp 98.6 F (37 C)   Ht 5\' 5"  (1.651 m)   Wt 162 lb (73.5 kg)   SpO2 97%   BMI 26.96 kg/m    CC: Chief Complaint  Patient presents with   Annual Exam    Patient feels a little more tired recently    Subjective:   HPI: Dana Rogers is a 68 y.o. female presenting on 07/20/2023 for Annual Exam (Patient feels a little more tired recently)  The patient presents for annual medicare wellness, complete physical and review of chronic health problems. He/She also has the following acute concerns today:  HR running low 50s.. no lightheadedness   I have personally reviewed the Medicare Annual Wellness questionnaire and have noted 1. The patient's medical and social history 2. Their use of alcohol, tobacco or illicit drugs 3. Their current medications and supplements 4. The patient's functional ability including ADL's, fall risks, home safety risks and hearing or visual             impairment. 5. Diet and physical activities 6. Evidence for depression or mood disorders 7.         Updated provider list Cognitive evaluation was performed and recorded on pt medicare questionnaire form. The patients weight, height, BMI and visual acuity have been recorded in the chart   I have made referrals, counseling and provided education to the patient based review of the above and I have provided the pt with a written personalized care plan for preventive services.   Documentation of this information was scanned into the electronic record under the media tab.   Advance directives and end of life planning reviewed in detail with patient and documented in EMR. Patient given handout on advance care directives if needed. HCPOA and living will updated if needed.  No falls in last 12 months.  Hearing and vision documented in EPIC.   Flowsheet Row  Office Visit from 07/20/2023 in [facility]  PHQ-2 Total Score 0      Hypertension:   Good control on amlodipine , HCTZ and losartan. BP Readings from Last 3 Encounters:  07/20/23 128/82  04/05/23 110/60  07/15/22 134/78  Using medication without problems or lightheadedness:  none Chest pain with exertion: none Edema: occ Short of breath: none Average home BPs: 130/70 Other issues: heart rate when she is checking 50-60.Marland Kitchen not symptomatic.  Elevated Cholesterol Well controlled last OV.:LDL at goal < 100 on atorvastatin 20 mg daily Lab Results  Component Value Date   CHOL 133 07/05/2023   HDL 39.50 07/05/2023   LDLCALC 77 07/05/2023   TRIG 87.0 07/05/2023   CHOLHDL 3 07/05/2023  Using medications without problems:none Muscle aches:  none Diet compliance: low carb, decreased sweet tea. Exercise:   treadmill Other complaints:  Diabetes:   Improved control  but not yet at goal on Farxiga 10 mg, metformin XR 500 mg daily Lab Results  Component Value Date   HGBA1C 7.4 (H) 07/05/2023  Using medications without difficulties: Hypoglycemic episodes:none Hyperglycemic episodes: none Feet problems: no ulcers Blood Sugars averaging:  FBS 120-125 eye exam within last year: done  Wt Readings from Last 3 Encounters:  07/20/23 162 lb (73.5 kg)  04/05/23 165 lb 2 oz (74.9 kg)  07/15/22 162 lb 2  oz (73.5 kg)   Body mass index is 26.96 kg/m.      Relevant past medical, surgical, family and social history reviewed and updated as indicated. Interim medical history since our last visit reviewed. Allergies and medications reviewed and updated. Outpatient Medications Prior to Visit  Medication Sig Dispense Refill   amLODipine (NORVASC) 10 MG tablet TAKE 1 TABLET BY MOUTH EVERY DAY 90 tablet 0   atorvastatin (LIPITOR) 20 MG tablet Take 1 tablet (20 mg total) by mouth daily. 90 tablet 3   Blood Glucose Monitoring Suppl (ACCU-CHEK GUIDE) w/Device KIT  Check blood sugar once daily 1 kit 0   cyclobenzaprine (FLEXERIL) 10 MG tablet Take 1 tablet (10 mg total) by mouth at bedtime as needed for muscle spasms. 30 tablet 1   dapagliflozin propanediol (FARXIGA) 10 MG TABS tablet TAKE 1 TABLET BY MOUTH EVERY DAY BEFORE BREAKFAST 90 tablet 0   diclofenac (VOLTAREN) 75 MG EC tablet TAKE 1 TABLET BY MOUTH TWICE A DAY 30 tablet 0   glucose blood (ACCU-CHEK GUIDE) test strip Check blood sugar once daily 100 each 3   glucose blood (ONE TOUCH ULTRA TEST) test strip Check blood sugar twice a day and as directed.Dx E11.65 100 each 3   hydrochlorothiazide (HYDRODIURIL) 25 MG tablet TAKE 1 TABLET (25 MG TOTAL) BY MOUTH DAILY. 90 tablet 0   Lancets (ONETOUCH ULTRASOFT) lancets Check blood sugar twice a day and as directed. Dx E11.65 100 each 3   losartan (COZAAR) 100 MG tablet TAKE 1 TABLET BY MOUTH EVERY DAY 90 tablet 0   metFORMIN (GLUCOPHAGE-XR) 500 MG 24 hr tablet TAKE 1 TABLET BY MOUTH EVERY DAY WITH BREAKFAST 90 tablet 0   No facility-administered medications prior to visit.     Per HPI unless specifically indicated in ROS section below Review of Systems  Constitutional:  Negative for fatigue and fever.  HENT:  Negative for congestion.   Eyes:  Negative for pain.  Respiratory:  Negative for cough and shortness of breath.   Cardiovascular:  Negative for chest pain, palpitations and leg swelling.  Gastrointestinal:  Negative for abdominal pain.  Genitourinary:  Negative for dysuria and vaginal bleeding.  Musculoskeletal:  Negative for back pain.  Neurological:  Negative for syncope, light-headedness and headaches.  Psychiatric/Behavioral:  Negative for dysphoric mood.    Objective:  BP 128/82   Pulse (!) 55   Temp 98.6 F (37 C)   Ht 5\' 5"  (1.651 m)   Wt 162 lb (73.5 kg)   SpO2 97%   BMI 26.96 kg/m   Wt Readings from Last 3 Encounters:  07/20/23 162 lb (73.5 kg)  04/05/23 165 lb 2 oz (74.9 kg)  07/15/22 162 lb 2 oz (73.5 kg)       Physical Exam Vitals and nursing note reviewed.  Constitutional:      General: She is not in acute distress.    Appearance: Normal appearance. She is well-developed. She is not ill-appearing or toxic-appearing.  HENT:     Head: Normocephalic.     Right Ear: Hearing, tympanic membrane, ear canal and external ear normal.     Left Ear: Hearing, tympanic membrane, ear canal and external ear normal.     Nose: Nose normal.  Eyes:     General: Lids are normal. Lids are everted, no foreign bodies appreciated.     Conjunctiva/sclera: Conjunctivae normal.     Pupils: Pupils are equal, round, and reactive to light.  Neck:     Thyroid: No thyroid  mass or thyromegaly.     Vascular: No carotid bruit.     Trachea: Trachea normal.  Cardiovascular:     Rate and Rhythm: Normal rate and regular rhythm.     Heart sounds: Normal heart sounds, S1 normal and S2 normal. No murmur heard.    No gallop.  Pulmonary:     Effort: Pulmonary effort is normal. No respiratory distress.     Breath sounds: Normal breath sounds. No wheezing, rhonchi or rales.  Abdominal:     General: Bowel sounds are normal. There is no distension or abdominal bruit.     Palpations: Abdomen is soft. There is no fluid wave or mass.     Tenderness: There is no abdominal tenderness. There is no guarding or rebound.     Hernia: No hernia is present.  Musculoskeletal:     Cervical back: Normal range of motion and neck supple.  Lymphadenopathy:     Cervical: No cervical adenopathy.  Skin:    General: Skin is warm and dry.     Findings: No rash.  Neurological:     Mental Status: She is alert.     Cranial Nerves: No cranial nerve deficit.     Sensory: No sensory deficit.  Psychiatric:        Mood and Affect: Mood is not anxious or depressed.        Speech: Speech normal.        Behavior: Behavior normal. Behavior is cooperative.        Judgment: Judgment normal.     Diabetic foot exam: Normal inspection No skin breakdown No  calluses  Normal DP pulses Normal sensation to light touch and monofilament Nails normal     Results for orders placed or performed in visit on 07/20/23  HM DIABETES FOOT EXAM   Collection Time: 07/20/23 12:00 AM  Result Value Ref Range   HM Diabetic Foot Exam done     This visit occurred during the SARS-CoV-2 public health emergency.  Safety protocols were in place, including screening questions prior to the visit, additional usage of staff PPE, and extensive cleaning of exam room while observing appropriate contact time as indicated for disinfecting solutions.   COVID 19 screen:  No recent travel or known exposure to COVID19 The patient denies respiratory symptoms of COVID 19 at this time. The importance of social distancing was discussed today.   Assessment and Plan   The patient's preventative maintenance and recommended screening tests for an annual wellness exam were reviewed in full today. Brought up to date unless services declined.  Counselled on the importance of diet, exercise, and its role in overall health and mortality. The patient's FH and SH was reviewed, including their home life, tobacco status, and drug and alcohol status.   Last PAP nml 2010, 2012..high risk HPV 05/2012, high risk HPV 08/2013,  And again in 2016 .Marland Kitchen Referred to Dr. Jennette Kettle GYN, neg colposcopy..  2018 neg pap, neg HPV.... no further indicated given age. No family history of ovarian or uterine cancer. Mammo 05/2023 Colon cancer screening: Could not tolerate golytely in past, 2023 cologuard.. repeat in 2026 Vaccines up to date with Tdap, PNA given  HD flu vaccine.   Due for shingles.  DEXA:  05/2023 osteoporosis.Marland Kitchen medication indicated. Hep C screening done  HIV: done  Microalbumin : done on  SGLT2, ARB, now starting GLP1 Problem List Items Addressed This Visit     Bradycardia     Chronic, She does have some associated  fatigue now, no lightheadedness.  Likely due to amlodipine, but she is at high  risk for heart disease... will refer her back to Cardiology.      Relevant Orders   Ambulatory referral to Cardiology   CKD stage G3a/A2, GFR 45-59 and albumin creatinine ratio 30-299 mg/g (HCC)    Improved. Due to DM and HTN On ARB and SGLT2i... now adding GLP1      Diabetes mellitus with retinopathy (HCC)   Chronic, Improved control Encouraged regular exercise such as daily walking and low carbohydrate diet. We will reevaluate in 3 months with repeat A1c after back on medication.  May need to add additional medicine as metformin at max tolerated dose.   Metformin 500 mg XL once daily and Farxiga 10 mg daily   Cannot tolerate higher dose of metformin.       Relevant Medications   Semaglutide,0.25 or 0.5MG /DOS, (OZEMPIC, 0.25 OR 0.5 MG/DOSE,) 2 MG/3ML SOPN   Diabetic retinopathy (HCC)   Yearly eye exam recommended      Relevant Medications   Semaglutide,0.25 or 0.5MG /DOS, (OZEMPIC, 0.25 OR 0.5 MG/DOSE,) 2 MG/3ML SOPN   Hyperlipidemia associated with type 2 diabetes mellitus (HCC)   Chronic, previously well-controlled  Sent in refill of atorvastatin 10 mg p.o. daily.  Reevaluate in 3 months.      Relevant Medications   Semaglutide,0.25 or 0.5MG /DOS, (OZEMPIC, 0.25 OR 0.5 MG/DOSE,) 2 MG/3ML SOPN   Hypertension associated with diabetes (HCC)   Stable, chronic.  Continue current medication.    amlodipine 10 mg daily, HCTZ 25 mg daily and losartan 100 mg daily.      Relevant Medications   Semaglutide,0.25 or 0.5MG /DOS, (OZEMPIC, 0.25 OR 0.5 MG/DOSE,) 2 MG/3ML SOPN   Microalbuminuria   On ARB and SGLT2i.. now adding GLP1      Osteoporosis   Recommend weight bearing exercise, calcium in diet and vit D supplement 400 IU 1-2 times daily. She will hold off on starting drug therapy at this time per pt request. Will re-eval in 2 years with DEXA.      Other Visit Diagnoses       Medicare annual wellness visit, subsequent    -  Primary       Kerby Nora, MD

## 2023-07-20 NOTE — Assessment & Plan Note (Signed)
 Chronic, previously well-controlled  Sent in refill of atorvastatin 10 mg p.o. daily.  Reevaluate in 3 months.

## 2023-07-20 NOTE — Assessment & Plan Note (Signed)
 On ARB and SGLT2i.. now adding GLP1

## 2023-07-20 NOTE — Assessment & Plan Note (Addendum)
 Improved. Due to DM and HTN On ARB and SGLT2i... now adding GLP1

## 2023-07-20 NOTE — Assessment & Plan Note (Signed)
Stable, chronic.  Continue current medication.    amlodipine 10 mg daily, HCTZ 25 mg daily and losartan 100 mg daily.

## 2023-07-20 NOTE — Patient Instructions (Addendum)
 Start ozempic 0.25 mg weekly.  Call or MyChart update on ozempic in month. With fasting blood sugars.   Call Dr. Antoine Poche for follow up with cardiology. Recommend weight bearing exercise, calcium in diet and vit D supplement 400 IU 1-2 times daily.

## 2023-07-22 ENCOUNTER — Encounter: Payer: Self-pay | Admitting: Family Medicine

## 2023-09-01 ENCOUNTER — Other Ambulatory Visit: Payer: Self-pay | Admitting: Family Medicine

## 2023-09-01 DIAGNOSIS — E1159 Type 2 diabetes mellitus with other circulatory complications: Secondary | ICD-10-CM

## 2023-11-01 ENCOUNTER — Ambulatory Visit: Admitting: Family Medicine

## 2023-11-06 NOTE — Progress Notes (Unsigned)
 Cardiology Office Note   Date:  11/08/2023   ID:  Dana Rogers, DOB Jun 22, 1955, MRN [id number]  PCP:  Avelina Greig FORBES, MD  Cardiologist:   None Referring:  Avelina Greig FORBES, MD  Chief Complaint  Patient presents with   Bradycardia      History of Present Illness: Dana Rogers is a 68 y.o. female who presents for evaluation of bradycardia.  She has been very fatigued.  She said this has been going on for several weeks.  She does not sleep well.  She has daytime somnolence.  She has snoring.  She probably does have apneic episodes.  She has had a very low heart rate in the lower 50s routinely.  She has not had any frank syncope.  She does have some dizziness.  She has some sporadic chest discomfort.  This is not happening routinely.  She is not having any new shortness of breath, PND or orthopnea.  She picks up her grandchild and she does some household chores but her job is more at a desk.  She works at [employer].  She has not had any recent cardiovascular testing.  She had a negative stress echo in 2014.  I saw her cardiac catheterization that she had in 2004 and she had no coronary disease.  She does have cardiovascular risk factors however.  I last saw her in 2019   Past Medical History:  Diagnosis Date   Diabetes mellitus    Hyperlipidemia    Hypertension    Peripheral edema    Rosacea     Past Surgical History:  Procedure Laterality Date   CARDIAC CATHETERIZATION  2004   CESAREAN SECTION     X2   CYSTECTOMY     UNDER ARM AND SCALP     Current Outpatient Medications  Medication Sig Dispense Refill   amLODipine  (NORVASC ) 10 MG tablet TAKE 1 TABLET BY MOUTH EVERY DAY 90 tablet 1   atorvastatin  (LIPITOR) 20 MG tablet Take 1 tablet (20 mg total) by mouth daily. 90 tablet 3   Blood Glucose Monitoring Suppl (ACCU-CHEK GUIDE) w/Device KIT Check blood sugar once daily 1 kit 0   cyclobenzaprine  (FLEXERIL ) 10 MG tablet Take 1 tablet (10 mg total) by mouth at bedtime as  needed for muscle spasms. 30 tablet 1   dapagliflozin  propanediol (FARXIGA ) 10 MG TABS tablet TAKE 1 TABLET BY MOUTH EVERY DAY BEFORE BREAKFAST 90 tablet 1   diclofenac  (VOLTAREN ) 75 MG EC tablet TAKE 1 TABLET BY MOUTH TWICE A DAY 30 tablet 0   glucose blood (ACCU-CHEK GUIDE) test strip Check blood sugar once daily 100 each 3   glucose blood (ONE TOUCH ULTRA TEST) test strip Check blood sugar twice a day and as directed.Dx E11.65 100 each 3   hydrochlorothiazide  (HYDRODIURIL ) 25 MG tablet TAKE 1 TABLET (25 MG TOTAL) BY MOUTH DAILY. 90 tablet 1   Lancets (ONETOUCH ULTRASOFT) lancets Check blood sugar twice a day and as directed. Dx E11.65 100 each 3   losartan  (COZAAR ) 100 MG tablet TAKE 1 TABLET BY MOUTH EVERY DAY 90 tablet 1   metFORMIN  (GLUCOPHAGE -XR) 500 MG 24 hr tablet TAKE 1 TABLET BY MOUTH EVERY DAY WITH BREAKFAST 90 tablet 1   Semaglutide ,0.25 or 0.5MG /DOS, (OZEMPIC , 0.25 OR 0.5 MG/DOSE,) 2 MG/3ML SOPN Inject 0.25 mg into the skin once a week. 3 mL 11   No current facility-administered medications for this visit.    Allergies:   Patient has no known allergies.  Social History:  The patient  reports that she has never smoked. She has never used smokeless tobacco. She reports that she does not drink alcohol and does not use drugs.   Family History:  The patient's family history includes Alcohol abuse in her brother; Asthma in her brother; Cirrhosis in her brother; Coronary artery disease in her father; Diabetes in her brother and sister; Heart disease in her mother; Hypertension in her brother and sister; Sleep apnea in her mother; Stroke in her father.    ROS:  Please see the history of present illness.   Otherwise, review of systems are positive for none.   All other systems are reviewed and negative.    PHYSICAL EXAM: VS:  BP (!) 160/80 (BP Location: Right Arm, Patient Position: Sitting, Cuff Size: Normal)   Pulse (!) 52   Ht 5' 5 (1.651 m)   Wt 162 lb 6.4 oz (73.7 kg)   SpO2  96%   BMI 27.02 kg/m  , BMI Body mass index is 27.02 kg/m. GENERAL:  Well appearing HEENT:  Pupils equal round and reactive, fundi not visualized, oral mucosa unremarkable NECK:  No jugular venous distention, waveform within normal limits, carotid upstroke brisk and symmetric, no bruits, no thyromegaly LYMPHATICS:  No cervical, inguinal adenopathy LUNGS:  Clear to auscultation bilaterally BACK:  No CVA tenderness CHEST:  Unremarkable HEART:  PMI not displaced or sustained,S1 and S2 within normal limits, no S3, no S4, no clicks, no rubs, no murmurs ABD:  Flat, positive bowel sounds normal in frequency in pitch, no bruits, no rebound, no guarding, no midline pulsatile mass, no hepatomegaly, no splenomegaly EXT:  2 plus pulses throughout, no edema, no cyanosis no clubbing SKIN:  No rashes no nodules NEURO:  Cranial nerves II through XII grossly intact, motor grossly intact throughout [facility]:  Cognitively intact, oriented to person place and time    EKG:  EKG Interpretation Date/Time:  Tuesday November 08 2023 15:41:58 EDT Ventricular Rate:  52 PR Interval:  198 QRS Duration:  104 QT Interval:  416 QTC Calculation: 386 R Axis:   -28  Text Interpretation: Sinus bradycardia Moderate voltage criteria for LVH, may be normal variant ( R in aVL , Cornell product ) T wave abnormality, consider lateral ischemia When compared with ECG of 25-Feb-2018 14:03, No significant change since last tracing Confirmed by Lavona Agent ([id number]) on 11/08/2023 4:01:44 PM     Recent Labs: 07/05/2023: ALT 14; BUN 17; Creatinine, Ser 1.01; Potassium 3.7; Sodium 143    Lipid Panel    Component Value Date/Time   CHOL 133 07/05/2023 1032   TRIG 87.0 07/05/2023 1032   HDL 39.50 07/05/2023 1032   CHOLHDL 3 07/05/2023 1032   VLDL 17.4 07/05/2023 1032   LDLCALC 77 07/05/2023 1032      Wt Readings from Last 3 Encounters:  11/08/23 162 lb 6.4 oz (73.7 kg)  07/20/23 162 lb (73.5 kg)  04/05/23 165 lb 2 oz (74.9  kg)      Other studies Reviewed: Additional studies/ records that were reviewed today include: Labs. Review of the above records demonstrates:  Please see elsewhere in the note.     ASSESSMENT AND PLAN:  Bradycardia: I will check a TSH.  I am also going to check a 3-day ZIO.  I will also be checking home sleep study as below.  I be looking for chronotropic competence on the monitor.  I do not see an indication for pacing at this point.  Sleep apnea: The  patient has a STOP-BANG of 5.  I am going to be ordering an Itamar to rule out sleep apnea.  Fatigue: I will be checking for sleep apnea as above and a TSH.  Current medicines are reviewed at length with the patient today.  The patient does not have concerns regarding medicines.  The following changes have been made:  no change  Labs/ tests ordered today include:   Orders Placed This Encounter  Procedures   TSH   LONG TERM MONITOR XT (3-14 DAYS)   EKG 12-Lead   Itamar Sleep Study     Disposition:   FU with with me in 2 months.   Signed, Lynwood Schilling, MD  11/08/2023 4:40 PM    [facility]

## 2023-11-08 ENCOUNTER — Encounter: Payer: Self-pay | Admitting: Cardiology

## 2023-11-08 ENCOUNTER — Telehealth: Payer: Self-pay | Admitting: Radiology

## 2023-11-08 ENCOUNTER — Ambulatory Visit

## 2023-11-08 ENCOUNTER — Ambulatory Visit: Attending: Cardiology | Admitting: Cardiology

## 2023-11-08 DIAGNOSIS — R001 Bradycardia, unspecified: Secondary | ICD-10-CM

## 2023-11-08 DIAGNOSIS — G4733 Obstructive sleep apnea (adult) (pediatric): Secondary | ICD-10-CM

## 2023-11-08 NOTE — Progress Notes (Unsigned)
 Enrolled patient for a 3 day Zio XT monitor to be mailed to patients home

## 2023-11-08 NOTE — Telephone Encounter (Signed)
 Patient agreement reviewed and signed on 11/08/2023.  WatchPAT issued to patient on 11/08/2023 by Woodie LOISE Prudent. Patient aware to not open the WatchPAT box until contacted with the activation PIN. Patient profile initialized in CloudPAT on 11/08/2023 by Rockie RAMAN. Device serial number: [id number]  Please list Reason for Call as Advice Only and type WatchPAT issued to patient in the comment box.

## 2023-11-08 NOTE — Patient Instructions (Signed)
 Medication Instructions:  No changes   *If you need a refill on your cardiac medications before your next appointment, please call your pharmacy*  Lab Work: TSH blood work, downstairs on level 1.  If you have labs (blood work) drawn today and your tests are completely normal, you will receive your results only by: MyChart Message (if you have MyChart) OR A paper copy in the mail If you have any lab test that is abnormal or we need to change your treatment, we will call you to review the results.  Testing/Procedures: Itamar Sleep Study Your physician has recommended that you have a sleep study. This test records several body functions during sleep, including: brain activity, eye movement, oxygen and carbon dioxide blood levels, heart rate and rhythm, breathing rate and rhythm, the flow of air through your mouth and nose, snoring, body muscle movements, and chest and belly movement.   3 Day Zio Heart Monitor Your physician has requested that you wear a Zio heart monitor for __3___ days. This will be mailed to your home with instructions on how to apply the monitor and how to return it when finished. Please allow 2 weeks after returning the heart monitor before our office calls you with the results.   Follow-Up: At [facility], you and your health needs are our priority.  As part of our continuing mission to provide you with exceptional heart care, our providers are all part of one team.  This team includes your primary Cardiologist (physician) and Advanced Practice Providers or APPs (Physician Assistants and Nurse Practitioners) who all work together to provide you with the care you need, when you need it.  Your next appointment:   2 months  Provider:   Lavona, MD  We recommend signing up for the patient portal called MyChart.  Sign up information is provided on this After Visit Summary.  MyChart is used to connect with patients for Virtual Visits (Telemedicine).  Patients  are able to view lab/test results, encounter notes, upcoming appointments, etc.  Non-urgent messages can be sent to your provider as well.   To learn more about what you can do with MyChart, go to [email or web address].   Other Instructions ZIO XT- Long Term Monitor Instructions  Your physician has requested you wear a ZIO patch monitor for 3 days.  This is a single patch monitor. [employer] supplies one patch monitor per enrollment. Additional stickers are not available. Please do not apply patch if you will be having a Nuclear Stress Test,  Echocardiogram, Cardiac CT, MRI, or Chest Xray during the period you would be wearing the  monitor. The patch cannot be worn during these tests. You cannot remove and re-apply the  ZIO XT patch monitor.  Your ZIO patch monitor will be mailed 3 day USPS to your address on file. It may take 3-5 days  to receive your monitor after you have been enrolled.  Once you have received your monitor, please review the enclosed instructions. Your monitor  has already been registered assigning a specific monitor serial # to you.  Billing and Patient Assistance Program Information  We have supplied [employer] with any of your insurance information on file for billing purposes. [employer] offers a sliding scale Patient Assistance Program for patients that do not have  insurance, or whose insurance does not completely cover the cost of the ZIO monitor.  You must apply for the Patient Assistance Program to qualify for this discounted rate.  To apply, please call [employer] at [phone],  select option 4, select option 2, ask to apply for  Patient Assistance Program. Meredeth will ask your household income, and how many people  are in your household. They will quote your out-of-pocket cost based on that information.  [employer] will also be able to set up a 63-month, interest-free payment plan if needed.  Applying the monitor   Shave hair from upper left chest.  Hold abrader  disc by orange tab. Rub abrader in 40 strokes over the upper left chest as  indicated in your monitor instructions.  Clean area with 4 enclosed alcohol pads. Let dry.  Apply patch as indicated in monitor instructions. Patch will be placed under collarbone on left  side of chest with arrow pointing upward.  Rub patch adhesive wings for 2 minutes. Remove white label marked 1. Remove the white  label marked 2. Rub patch adhesive wings for 2 additional minutes.  While looking in a mirror, press and release button in center of patch. A small green light will  flash 3-4 times. This will be your only indicator that the monitor has been turned on.  Do not shower for the first 24 hours. You may shower after the first 24 hours.  Press the button if you feel a symptom. You will hear a small click. Record Date, Time and  Symptom in the Patient Logbook.  When you are ready to remove the patch, follow instructions on the last 2 pages of Patient  Logbook. Stick patch monitor onto the last page of Patient Logbook.  Place Patient Logbook in the blue and white box. Use locking tab on box and tape box closed  securely. The blue and white box has prepaid postage on it. Please place it in the mailbox as  soon as possible. Your physician should have your test results approximately 7 days after the  monitor has been mailed back to [facility].  Call [facility] Customer Care at [phone] if you have questions regarding  your ZIO XT patch monitor. Call them immediately if you see an orange light blinking on your  monitor.  If your monitor falls off in less than 4 days, contact our Monitor department at [phone].  If your monitor becomes loose or falls off after 4 days call [employer] at [phone] for  suggestions on securing your monitor

## 2023-12-04 DIAGNOSIS — R001 Bradycardia, unspecified: Secondary | ICD-10-CM | POA: Diagnosis not present

## 2023-12-05 ENCOUNTER — Ambulatory Visit: Payer: Self-pay | Admitting: Cardiology

## 2024-01-03 ENCOUNTER — Other Ambulatory Visit: Payer: Self-pay | Admitting: Family Medicine

## 2024-01-12 ENCOUNTER — Telehealth: Payer: Self-pay

## 2024-01-12 NOTE — Telephone Encounter (Signed)
 Ordering provider: Dr. Lavona  Associated diagnoses:   G47.33 (OSA (obstructive sleep apnea) ) WatchPAT PA obtained on 01/12/2024 by Dena JAYSON Hesselbach, CMA. Authorization: No prior authorization is required per [facility] provider portal.  Patient notified of PIN ([phone]) on 01/12/2024 via Notification Method: phone.  Phone note routed to covering staff for follow-up.

## 2024-01-19 ENCOUNTER — Ambulatory Visit: Admitting: Cardiology

## 2024-01-26 NOTE — Telephone Encounter (Signed)
 Spoke with pt regarding her Itamar Home Sleep study. Pt stated she has not completed it yet and will need the pin number again as she has misplaced it. Pt was told our sleep team would be notified. Pt verbalized understanding. All questions if any were answered.

## 2024-01-27 NOTE — Telephone Encounter (Signed)
**Note De-Identified [name] Obfuscation** I called the pt and provided her with the WatchPAT One-HST Device PIN # of [id number]. I also advised her that if she has any problems when she is ready to do her home sleep test to call the number on the side of the device box for assistance.  She verbalized understanding to all information given and thanked me for calling her back.

## 2024-02-10 ENCOUNTER — Ambulatory Visit: Admitting: Cardiology

## 2024-02-20 ENCOUNTER — Other Ambulatory Visit: Payer: Self-pay | Admitting: Family Medicine

## 2024-02-20 NOTE — Telephone Encounter (Signed)
 Please schedule Diabetes follow with Dr. Avelina.  She was suppose to follow up back in June.

## 2024-02-23 ENCOUNTER — Ambulatory Visit: Admitting: Family Medicine

## 2024-02-23 DIAGNOSIS — E11319 Type 2 diabetes mellitus with unspecified diabetic retinopathy without macular edema: Secondary | ICD-10-CM

## 2024-02-28 ENCOUNTER — Ambulatory Visit (INDEPENDENT_AMBULATORY_CARE_PROVIDER_SITE_OTHER): Admitting: Family Medicine

## 2024-02-28 VITALS — BP 138/72 | HR 54 | Temp 97.7°F | Ht 65.0 in | Wt 160.4 lb

## 2024-02-28 DIAGNOSIS — E11319 Type 2 diabetes mellitus with unspecified diabetic retinopathy without macular edema: Secondary | ICD-10-CM | POA: Diagnosis not present

## 2024-02-28 DIAGNOSIS — I152 Hypertension secondary to endocrine disorders: Secondary | ICD-10-CM

## 2024-02-28 DIAGNOSIS — E1159 Type 2 diabetes mellitus with other circulatory complications: Secondary | ICD-10-CM

## 2024-02-28 DIAGNOSIS — Z23 Encounter for immunization: Secondary | ICD-10-CM

## 2024-02-28 DIAGNOSIS — M25511 Pain in right shoulder: Secondary | ICD-10-CM | POA: Diagnosis not present

## 2024-02-28 DIAGNOSIS — Z7984 Long term (current) use of oral hypoglycemic drugs: Secondary | ICD-10-CM

## 2024-02-28 LAB — POCT GLYCOSYLATED HEMOGLOBIN (HGB A1C): Hemoglobin A1C: 7.4 % — AB (ref 4.0–5.6)

## 2024-02-28 MED ORDER — MELOXICAM 15 MG PO TABS: 15.0000 mg | ORAL_TABLET | Freq: Every day | ORAL | 0 refills | Status: AC

## 2024-02-28 NOTE — Assessment & Plan Note (Signed)
 Chronic, Improved control Encouraged regular exercise such as daily walking and low carbohydrate diet.  Metformin  500 mg XL once daily and Farxiga  10 mg daily Has not been taking semaglutide 

## 2024-02-28 NOTE — Progress Notes (Signed)
 Patient ID: Dana Rogers, female    DOB: [birth year], 68 y.o.   MRN: [id number]  This visit was conducted in person.  BP 138/72   Pulse (!) 54   Temp 97.7 F (36.5 C)   Ht 5' 5 (1.651 m)   Wt 160 lb 6.4 oz (72.8 kg)   SpO2 96%   BMI 26.69 kg/m    CC: Chief Complaint  Patient presents with   Diabetes    41mo follow up; no concerns; haven't really been taking the ozempic     Subjective:   HPI: Dana Rogers is a 68 y.o. female presenting on 02/28/2024 for Diabetes (41mo follow up; no concerns; haven't really been taking the ozempic )   Hypertension:   Good control on amlodipine  , HCTZ and losartan . BP Readings from Last 3 Encounters:  02/28/24 138/72  11/08/23 (!) 160/80  07/20/23 128/82  Using medication without problems or lightheadedness:  none Chest pain with exertion: non e Edema: occ Short of breath: none Average home BPs: 130/70 Other issues: heart rate when she is checking 50-60.SABRA not symptomatic.  Elevated Cholesterol Well controlled last OV.:LDL at goal < 100 on atorvastatin  20 mg daily Lab Results  Component Value Date   CHOL 133 07/05/2023   HDL 39.50 07/05/2023   LDLCALC 77 07/05/2023   TRIG 87.0 07/05/2023   CHOLHDL 3 07/05/2023  Using medications without problems:none Muscle aches:  none Diet compliance: low carb, decreased sweet tea. Exercise:   treadmill Other complaints:  Diabetes:   Improved control  but not yet at goal on Farxiga  10 mg, metformin  XR 500 mg daily. At last OV she was started on semaglutide  but has not been taking it... has some initial nausea... willing to try again. Lab Results  Component Value Date   HGBA1C 7.4 (A) 02/28/2024  Using medications without difficulties: Hypoglycemic episodes:none Hyperglycemic episodes: none Feet problems: no ulcers Blood Sugars averaging:  FBS 120-125 eye exam within last year: done  Hx of microalbuminuria.  Wt Readings from Last 3 Encounters:  02/28/24 160 lb 6.4 oz (72.8 kg)   11/08/23 162 lb 6.4 oz (73.7 kg)  07/20/23 162 lb (73.5 kg)   Body mass index is 26.69 kg/m.       She is having   pain in right shoulder  for 1 months..  pain with abduction, no pain with internal or external rotation.  Occ using motrin  but not helpful.  No fall.  No swelling or redness.  Requests referral to [facility] in [place].    Relevant past medical, surgical, family and social history reviewed and updated as indicated. Interim medical history since our last visit reviewed. Allergies and medications reviewed and updated. Outpatient Medications Prior to Visit  Medication Sig Dispense Refill   amLODipine  (NORVASC ) 10 MG tablet TAKE 1 TABLET BY MOUTH EVERY DAY 90 tablet 0   atorvastatin  (LIPITOR) 20 MG tablet Take 1 tablet (20 mg total) by mouth daily. 90 tablet 3   Blood Glucose Monitoring Suppl (ACCU-CHEK GUIDE) w/Device KIT Check blood sugar once daily 1 kit 0   cyclobenzaprine  (FLEXERIL ) 10 MG tablet Take 1 tablet (10 mg total) by mouth at bedtime as needed for muscle spasms. 30 tablet 1   dapagliflozin  propanediol (FARXIGA ) 10 MG TABS tablet TAKE 1 TABLET BY MOUTH EVERY DAY BEFORE BREAKFAST 90 tablet 1   glucose blood (ACCU-CHEK GUIDE) test strip Check blood sugar once daily 100 each 3   glucose blood (ONE TOUCH ULTRA TEST) test strip Check  blood sugar twice a day and as directed.Dx E11.65 100 each 3   hydrochlorothiazide  (HYDRODIURIL ) 25 MG tablet TAKE 1 TABLET (25 MG TOTAL) BY MOUTH DAILY. 90 tablet 1   Lancets (ONETOUCH ULTRASOFT) lancets Check blood sugar twice a day and as directed. Dx E11.65 100 each 3   losartan  (COZAAR ) 100 MG tablet TAKE 1 TABLET BY MOUTH EVERY DAY 90 tablet 1   metFORMIN  (GLUCOPHAGE -XR) 500 MG 24 hr tablet TAKE 1 TABLET BY MOUTH EVERY DAY WITH BREAKFAST 90 tablet 1   Semaglutide ,0.25 or 0.5MG /DOS, (OZEMPIC , 0.25 OR 0.5 MG/DOSE,) 2 MG/3ML SOPN Inject 0.25 mg into the skin once a week. 3 mL 11   diclofenac  (VOLTAREN ) 75 MG EC tablet TAKE 1 TABLET BY  MOUTH TWICE A DAY 30 tablet 0   No facility-administered medications prior to visit.     Per HPI unless specifically indicated in ROS section below Review of Systems  Constitutional:  Negative for fatigue and fever.  HENT:  Negative for congestion.   Eyes:  Negative for pain.  Respiratory:  Negative for cough and shortness of breath.   Cardiovascular:  Negative for chest pain, palpitations and leg swelling.  Gastrointestinal:  Negative for abdominal pain.  Genitourinary:  Negative for dysuria and vaginal bleeding.  Musculoskeletal:  Negative for back pain.  Neurological:  Negative for syncope, light-headedness and headaches.  Psychiatric/Behavioral:  Negative for dysphoric mood.    Objective:  BP 138/72   Pulse (!) 54   Temp 97.7 F (36.5 C)   Ht 5' 5 (1.651 m)   Wt 160 lb 6.4 oz (72.8 kg)   SpO2 96%   BMI 26.69 kg/m   Wt Readings from Last 3 Encounters:  02/28/24 160 lb 6.4 oz (72.8 kg)  11/08/23 162 lb 6.4 oz (73.7 kg)  07/20/23 162 lb (73.5 kg)      Physical Exam Vitals and nursing note reviewed.  Constitutional:      General: She is not in acute distress.    Appearance: Normal appearance. She is well-developed. She is not ill-appearing or toxic-appearing.  HENT:     Head: Normocephalic.     Right Ear: Hearing, tympanic membrane, ear canal and external ear normal.     Left Ear: Hearing, tympanic membrane, ear canal and external ear normal.     Nose: Nose normal.  Eyes:     General: Lids are normal. Lids are everted, no foreign bodies appreciated.     Conjunctiva/sclera: Conjunctivae normal.     Pupils: Pupils are equal, round, and reactive to light.  Neck:     Thyroid : No thyroid  mass or thyromegaly.     Vascular: No carotid bruit.     Trachea: Trachea normal.  Cardiovascular:     Rate and Rhythm: Normal rate and regular rhythm.     Heart sounds: Normal heart sounds, S1 normal and S2 normal. No murmur heard.    No gallop.  Pulmonary:     Effort: Pulmonary  effort is normal. No respiratory distress.     Breath sounds: Normal breath sounds. No wheezing, rhonchi or rales.  Abdominal:     General: Bowel sounds are normal. There is no distension or abdominal bruit.     Palpations: Abdomen is soft. There is no fluid wave or mass.     Tenderness: There is no abdominal tenderness. There is no guarding or rebound.     Hernia: No hernia is present.  Musculoskeletal:     Right shoulder: Tenderness and bony tenderness present.  No swelling, deformity or effusion. Decreased range of motion.     Cervical back: Normal range of motion and neck supple.     Comments: TTP laterally over bursa  Lymphadenopathy:     Cervical: No cervical adenopathy.  Skin:    General: Skin is warm and dry.     Findings: No rash.  Neurological:     Mental Status: She is alert.     Cranial Nerves: No cranial nerve deficit.     Sensory: No sensory deficit.  Psychiatric:        Mood and Affect: Mood is not anxious or depressed.        Speech: Speech normal.        Behavior: Behavior normal. Behavior is cooperative.        Judgment: Judgment normal.         Results for orders placed or performed in visit on 02/28/24  POCT glycosylated hemoglobin (Hb A1C)   Collection Time: 02/28/24  9:17 AM  Result Value Ref Range   Hemoglobin A1C 7.4 (A) 4.0 - 5.6 %   HbA1c POC (<> result, manual entry)     HbA1c, POC (prediabetic range)     HbA1c, POC (controlled diabetic range)      This visit occurred during the SARS-CoV-2 public health emergency.  Safety protocols were in place, including screening questions prior to the visit, additional usage of staff PPE, and extensive cleaning of exam room while observing appropriate contact time as indicated for disinfecting solutions.   COVID 19 screen:  No recent travel or known exposure to COVID19 The patient denies respiratory symptoms of COVID 19 at this time. The importance of social distancing was discussed today.   Assessment and  Plan   The patient's preventative maintenance and recommended screening tests for an annual wellness exam were reviewed in full today. Brought up to date unless services declined.  Counselled on the importance of diet, exercise, and its role in overall health and mortality. The patient's FH and SH was reviewed, including their home life, tobacco status, and drug and alcohol status.   Last PAP nml 2010, 2012..high risk HPV 05/2012, high risk HPV 08/2013,  And again in 2016 .SABRA Referred to Dr. Rosalynn GYN, neg colposcopy..  2018 neg pap, neg HPV.... no further indicated given age. No family history of ovarian or uterine cancer. Mammo 05/2023 Colon cancer screening: Could not tolerate golytely in past, 2023 cologuard.. repeat in 2026 Vaccines up to date with Tdap, PNA given  HD flu vaccine.   Due for shingles.  DEXA:  05/2023 osteoporosis.SABRA medication indicated. Hep C screening done  HIV: done  Microalbumin : done on  SGLT2, ARB, now starting GLP1 Problem List Items Addressed This Visit     Acute pain of right shoulder   Acute, consistent with either bursitis versus rotator cuff tendinitis. Will treat with course of meloxicam and start home physical therapy exercises.  Will refer to orthopedics for further evaluation and possible steroid injection. No red flags or indication for x-ray at this time.      Relevant Orders   Ambulatory referral to Orthopedic Surgery   Diabetes mellitus with retinopathy (HCC) - Primary   Chronic, Improved control Encouraged regular exercise such as daily walking and low carbohydrate diet.  Metformin  500 mg XL once daily and Farxiga  10 mg daily Has not been taking semaglutide        Relevant Orders   POCT glycosylated hemoglobin (Hb A1C) (Completed)   Hypertension associated with  diabetes (HCC)   Stable, chronic.  Continue current medication.    amlodipine  10 mg daily, HCTZ 25 mg daily and losartan  100 mg daily.      Other Visit Diagnoses        Encounter for immunization       Relevant Orders   Flu vaccine HIGH DOSE PF(Fluzone Trivalent) (Completed)       Greig Ring, MD

## 2024-02-28 NOTE — Patient Instructions (Signed)
Set up yearly eye exam for diabetes and have the opthalmologist send [place] a copy of the evaluation for the chart.

## 2024-02-28 NOTE — Assessment & Plan Note (Signed)
 Acute, consistent with either bursitis versus rotator cuff tendinitis. Will treat with course of meloxicam and start home physical therapy exercises.  Will refer to orthopedics for further evaluation and possible steroid injection. No red flags or indication for x-ray at this time.

## 2024-02-28 NOTE — Assessment & Plan Note (Signed)
Stable, chronic.  Continue current medication.    amlodipine 10 mg daily, HCTZ 25 mg daily and losartan 100 mg daily.

## 2024-03-27 LAB — OPHTHALMOLOGY REPORT-SCANNED

## 2024-05-13 ENCOUNTER — Other Ambulatory Visit: Payer: Self-pay | Admitting: Family Medicine

## 2024-05-13 DIAGNOSIS — E1159 Type 2 diabetes mellitus with other circulatory complications: Secondary | ICD-10-CM

## 2024-05-30 ENCOUNTER — Ambulatory Visit: Admitting: Family Medicine

## 2024-06-08 ENCOUNTER — Ambulatory Visit: Admitting: Family Medicine
